# Patient Record
Sex: Male | Born: 1935 | Race: White | Hispanic: No
Health system: Midwestern US, Community
[De-identification: ages and names within clinical notes are randomized; demographics above are authoritative.]

## PROBLEM LIST (undated history)

## (undated) DIAGNOSIS — D649 Anemia, unspecified: Secondary | ICD-10-CM

## (undated) DIAGNOSIS — I10 Essential (primary) hypertension: Secondary | ICD-10-CM

## (undated) DIAGNOSIS — J449 Chronic obstructive pulmonary disease, unspecified: Secondary | ICD-10-CM

## (undated) DIAGNOSIS — Z950 Presence of cardiac pacemaker: Secondary | ICD-10-CM

## (undated) DIAGNOSIS — I2510 Atherosclerotic heart disease of native coronary artery without angina pectoris: Secondary | ICD-10-CM

---

## 2006-06-27 ENCOUNTER — Inpatient Hospital Stay
Admission: EM | Admit: 2006-06-27 | Discharge: 2006-07-01 | Disposition: A | Discharge status: Home Health | Source: Emergency Department | Attending: Internal Medicine | Admitting: Internal Medicine

## 2006-06-27 NOTE — H&P (Unsigned)
[facility]   [address]   [place], [place]. [address]   [phone]     HISTORY AND PHYSICAL    NAME: Gilbert Nunez, Gilbert Nunez MR: [id number]  LOC: ER SEX: [name]: [id number]  DOB: [birth year] AGE: 73 PT: E  ADMIT: 06/27/2006 DSCH: MSV: EMR    DATE OF ADMISSION: 06-27-06    REASON FOR ADMISSION: Shortness of breath and chills.    [facility]    HISTORY OF PRESENT ILLNESS:  Mr. Phillippi is a 73 year old gentleman who presents to the ER complaining  of chills, chest tightness. Mr. Okerlund states he was feeling his usual  self yesterday but this morning when he woke up he had chills, tightness  in his chest, some mild productive cough. He states he's had rhinorrhea  for several weeks. He just overall feels ill. On my exam he denies  nausea or vomiting though there was some report he had some nausea  earlier by the ER. He denies chest pain. He complains of a sensation of  tightness in his chest, says he's had shortness of breath which has been  chronic for four to five months. He denies abdominal pain, denies  nausea, vomiting, diarrhea or constipation. Denies dysuria. He says his  appetite has been fair until today. Mr. Jarnigan has a history of Crohn's  disease and is status post colon resection x2 in the 1950's and 1960's.  He also has a history of coronary artery disease and is status post CABG  in 1998. He seems to be otherwise relatively active. He lives alone. He  does have some limitations secondary to some vision problems. On  evaluation in the ER he's been found to be tachycardic with heart rate in  the 110's to 130's with an elevated lactic acid. We've been asked to  evaluate him for admission.    PAST MEDICAL HISTORY:   1. Crohn's disease.   2. Coronary artery disease.   3. Glaucoma.   4. Macular degeneration.    PAST SURGICAL HISTORY:   1. Colon resection in 1959 and 1967.   2. CABG in 1998.    SOCIAL HISTORY:  Patient does have a long standing history of tobacco for most of his life   but he quit about eight years ago. He doesn't really drink alcohol with  the exception of an occasional [place] Cr me. He lives alone. He does  have a son in the [place] area who is actually an Charity fundraiser here at the  hospital.    ALLERGIES: No known drug allergies.    MEDICATIONS:   1. Metoprolol 50mg  p.o. b.i.d.   2. Lisinopril 20mg  p.o. daily.   3. Norvasc 10mg  p.o. daily.   4. Timolol ophthalmic one drop in each eye twice a day.   5. Bromadine ophthalmic one drop in each eye twice a day.   6. Travatan ophthalmic one drop in each eye every evening.    REVIEW OF SYSTEMS:  The patient has chills, mild productive cough, shortness of breath with  chest tightness and rhinorrhea. He denies chest pain, denies nausea,  vomiting, abdominal pain, diarrhea, constipation, dysuria. Denies  dizziness or vision changes. Denies rashes. Appetite has been fair. He  does have some lower extremity edema in the right leg greater than the  left. This is chronic since vein harvesting for his CABG.    PHYSICAL EXAMINATION:  Temperature 99.6, heart rate 133, now down to 115 after some IV fluids,  blood pressure 132/61, respirations 18, oxygen 95% on two liters. In  general, this is a well developed well nourished but ill appearing  gentleman who is pleasant to talk to. HEENT: normocephalic, atraumatic.  Pupils equal and reactive. Oropharynx is pink. Neck is somewhat limited  in mobility and patient states this is chronic. Heart: regular rhythm  with tachycardia. No murmur. Lungs are clear to auscultation  anteriorly. Abdomen is soft, non-tender, non-distended. Extremities: 1+  pitting edema in the right lower extremity. No edema in the left.  Neurologic: cranial nerves grossly intact. Psychiatric: patient is alert  and appropriate.    EKG was reviewed by me and shows tachycardia with a left bundle branch  block. Chest x-ray was reviewed by me and confirmed with the  radiologist. I question whether there may be the beginnings of a left   lower lobe infiltrate however Radiology report did not find any focal  consolidations.  LABORATORY DATA:  WBC 8400, 14% bands, 81% neutrophils, hemoglobin 11.2, hematocrit 36.3,  platelets 223,000, MCV 70. Metabolic panel: sodium 137, potassium 3.4,  chloride 100, bicarb 24, BUN 13, creatinine 1.1, glucose 174, calcium 9.  CKMB 2.6, myoglobin 83, troponin less than 0.05. BNP mildly elevated at  150. Urinalysis: negative nitrites, negative leukocytes. Liver function  tests: total bilirubin elevated at 4.2 with a normal SGOT of 27, normal  SGPT 35, alkaline phosphatase 98.    ASSESSMENT:  Mr. Ledford is a 73 year old gentleman who presents with acute onset of  chills, subjective fever and chest tightness. Chest x-ray is suggestive  of possible early pneumonia in the left lower lobe.   1. Early sepsis with tachycardia, bandemia, increased lactate and low   grade temperature.   2. Possible left lower lobe pneumonia.   3. Elevated total bilirubin with no GI complaints and otherwise normal   liver function tests. Concern for hemolysis as the etiology.   4. Mild hypokalemia.   5. History of coronary artery disease.    PLAN:  Mr. Doolan will be admitted to a remote telemetry bed. He'll be given IV  fluids aggressively though carefully given his cardiac history. He'll be  treated with IV antibiotics with Levaquin. We will swab him for  influenza. I'll also obtain a fractionated bilirubin in order to  evaluate for hemolysis, possible DIC. Will treat the patient  supportively with oxygen. Will replete his potassium and will follow him  closely.           __________________________________   Carman Ching, MD A         This is an unverified document unless signed by physician.  TID: [id number] DT: 06/27/2006 3:58 P  JOB: [id number] DOC#: [id number] DD: 06/27/2006   cc: [facility]   Carman Ching, MD

## 2006-07-01 NOTE — Discharge Summary (Unsigned)
[facility]   [address]   [place], [place]. [address]   [phone]     DISCHARGE SUMMARY    NAME: Gilbert Nunez, Gilbert Nunez MR: [id number]  LOC: [id number] SEX: M ACCT: [id number]  DOB: 1927/11/25 AGE: 73 PT: I  ADMIT: 06/27/2006 DSCH: MSV: MED      PRIMARY CARE PHYSICIAN: [facility]    HISTORY OF PRESENT ILLNESS: Gilbert Nunez is a 73 year old white male who  was admitted on 06/27/06. The patient presented at that time with  complaints of shortness of breath and chills. He apparently was not  feeling himself for about a day prior to his presentation. On the day of  his admission, he had chills, complaints of tightness in his chest and  some productive cough. In the Emergency Room he had an x-ray that showed  a developing left lung infiltrate. He also had white blood cell count  with 14% bandemia. He was tachycardic and febrile and so was admitted  for further management. The patient also had some abnormal liver  function tests, specifically an elevated bilirubin level, which was  thought to be secondary to hemolysis related to his sepsis and his  vitamins. The patient was admitted with a diagnosis of sepsis and left  lower lung pneumonia and hyperbilirubinemia, also mild hypokalemia.  Throughout the patient's hospital course he was treated with Levaquin and  he quickly defervesced. His hyperbilirubinemia also decreased. His  bilirubin today is 2.3. It was thought that maybe his hyperbilirubinemia  may have been related to some hemolysis because of borderline low  haptoglobin levels but also may be related to his vitamins. The patient  takes multiple supplements, specifically DHEA and Coenzyme Q10. It will  be recommended that patient have a follow up of these studies in another  week to make sure this is improved. His other liver function tests were  within normal limits. At this time, the patient is stable and readied  for discharge.    DISCHARGE DIAGNOSES:  1. Sepsis: This has resolved.   2. Left lower lobe pneumonia: The patient has been treated with   Levaquin, he is now changed to by mouth, and he will be discharged   with an additional seven days.  3. Isolated hyperbilirubinemia: Etiology not clear, however suspect   multivitamins as causative. He will need to have this followed up   within a week. He is advised to hold all his supplements until his   follow up blood work and he is advised by his physician.  4. Anemia: The patient gives history of thalassemia, however he had   some evidence of iron deficiency. We will refer him back to his   primary care physician for outpatient workup.  5. Chronic back pain.  6. History of coronary artery disease.  7. Hypokalemia: This has been repleted.                       __________________________________   Ferdinand Cava, MD A     This is an unverified document unless signed by physician.    TID: [id number] DT: 07/01/2006 10:08 A  JOB: [id number] DOC#: [id number] DD: 07/01/2006    cc: Ferdinand Cava, MD

## 2010-12-10 ENCOUNTER — Inpatient Hospital Stay
Admit: 2010-12-10 | Discharge: 2010-12-13 | Disposition: A | Discharge status: Home / Self Care | Source: Home / Self Care | Attending: Cardiovascular Disease | Admitting: Cardiovascular Disease

## 2010-12-10 DIAGNOSIS — I214 Non-ST elevation (NSTEMI) myocardial infarction: Secondary | ICD-10-CM

## 2010-12-10 LAB — FERRITIN: Ferritin: 67 NG/ML (ref 8–388)

## 2010-12-10 LAB — METABOLIC PANEL, COMPREHENSIVE
A-G Ratio: 1 — ABNORMAL LOW (ref 1.2–3.5)
ALT (SGPT): 20 U/L (ref 12–65)
AST (SGOT): 28 U/L (ref 15–37)
Albumin: 3.4 g/dL (ref 3.2–4.6)
Alk. phosphatase: 61 U/L (ref 50–136)
Anion gap: 12 mmol/L (ref 7–16)
BUN: 14 MG/DL (ref 8–23)
Bilirubin, total: 3.1 MG/DL — ABNORMAL HIGH (ref 0.2–1.1)
CO2: 24 MMOL/L (ref 21–32)
Calcium: 8.5 MG/DL (ref 8.3–10.4)
Chloride: 106 MMOL/L (ref 98–107)
Creatinine: 1.2 MG/DL (ref 0.8–1.5)
GFR est AA: >60 mL/min/{1.73_m2} (ref >60)
GFR est non-AA: >60 mL/min/{1.73_m2} (ref >60)
Globulin: 3.4 g/dL (ref 2.3–3.5)
Glucose: 135 MG/DL — ABNORMAL HIGH (ref 65–100)
Potassium: 3.2 MMOL/L — ABNORMAL LOW (ref 3.5–5.1)
Protein, total: 6.8 g/dL (ref 6.3–8.2)
Sodium: 142 MMOL/L (ref 136–145)

## 2010-12-10 LAB — EKG, 12 LEAD, INITIAL
Atrial Rate: 92 {beats}/min
Calculated P Axis: 63 degrees
Calculated R Axis: 23 degrees
Calculated T Axis: -176 degrees
P-R Interval: 256 ms
Q-T Interval: 370 ms
QRS Duration: 162 ms
QTC Calculation (Bezet): 457 ms
Ventricular Rate: 92 {beats}/min

## 2010-12-10 LAB — CBC WITH AUTOMATED DIFF
ABS. BASOPHILS: 0 10*3/uL (ref 0.0–0.2)
ABS. EOSINOPHILS: 0 10*3/uL (ref 0.0–0.8)
ABS. IMM. GRANS.: 0 10*3/uL (ref 0.0–0.5)
ABS. LYMPHOCYTES: 0.9 10*3/uL (ref 0.5–4.6)
ABS. MONOCYTES: 1.1 10*3/uL (ref 0.1–1.3)
ABS. NEUTROPHILS: 6.3 10*3/uL (ref 1.7–8.2)
BASOPHILS: 0 % (ref 0.0–2.0)
EOSINOPHILS: 0 % — ABNORMAL LOW (ref 0.5–7.8)
HCT: 29.8 % — ABNORMAL LOW (ref 41.1–50.3)
HGB: 9.6 g/dL — ABNORMAL LOW (ref 13.2–17.1)
IMMATURE GRANULOCYTES: 0.1 % (ref 0.0–5.0)
LYMPHOCYTES: 11 % — ABNORMAL LOW (ref 13–44)
MCH: 20.6 PG — ABNORMAL LOW (ref 26.1–32.9)
MCHC: 32.2 g/dL (ref 31.4–35.0)
MCV: 64.1 FL — ABNORMAL LOW (ref 79.6–97.8)
MONOCYTES: 14 % — ABNORMAL HIGH (ref 4.0–12.0)
MPV: 10.1 FL — ABNORMAL LOW (ref 10.8–14.1)
NEUTROPHILS: 75 % (ref 43–78)
PLATELET: 171 10*3/uL (ref 150–450)
RBC: 4.65 M/uL (ref 4.23–5.67)
RDW: 15.8 % — ABNORMAL HIGH (ref 11.9–14.6)
WBC: 8.3 10*3/uL (ref 4.3–11.1)

## 2010-12-10 LAB — POC TROPONIN: Troponin-I (POC): 0.65 ng/ml — ABNORMAL HIGH (ref 0.0–0.08)

## 2010-12-10 LAB — PTT: aPTT: 25.9 s (ref 23.5–31.7)

## 2010-12-10 LAB — IRON: Iron: 23 ug/dL — ABNORMAL LOW (ref 35–150)

## 2010-12-10 LAB — VITAMIN B12: Vitamin B12: 309 pg/mL (ref >200)

## 2010-12-10 LAB — BNP: BNP: 477 pg/mL

## 2010-12-10 LAB — PROTHROMBIN TIME + INR
INR: 1.1 (ref 0.9–1.2)
Prothrombin time: 10.9 s — ABNORMAL HIGH (ref 9.4–10.8)

## 2010-12-10 MED ORDER — NITROGLYCERIN 0.4 MG SUBLINGUAL TAB
0.4 mg | SUBLINGUAL | Status: DC | PRN
Start: 2010-12-10 — End: 2010-12-10

## 2010-12-10 MED ORDER — MIDAZOLAM 1 MG/ML IJ SOLN
1 mg/mL | INTRAMUSCULAR | Status: DC | PRN
Start: 2010-12-10 — End: 2010-12-10
  Administered 2010-12-10: 20:00:00 via INTRAVENOUS

## 2010-12-10 MED ORDER — FUROSEMIDE 10 MG/ML IJ SOLN
10 mg/mL | Freq: Once | INTRAMUSCULAR | Status: AC
Start: 2010-12-10 — End: 2010-12-10
  Administered 2010-12-10: 21:00:00 via INTRAVENOUS

## 2010-12-10 MED ORDER — FENTANYL CITRATE (PF) 50 MCG/ML IJ SOLN
50 mcg/mL | INTRAMUSCULAR | Status: DC | PRN
Start: 2010-12-10 — End: 2010-12-10
  Administered 2010-12-10: 20:00:00 via INTRAVENOUS

## 2010-12-10 MED ORDER — NITROGLYCERIN IN D5W 200 MCG/ML IV
50 mg/2 mL (200 mcg/mL) | INTRAVENOUS | Status: DC
Start: 2010-12-10 — End: 2010-12-12
  Administered 2010-12-10 – 2010-12-11 (×10): via INTRAVENOUS

## 2010-12-10 MED ORDER — SODIUM CHLORIDE 0.9 % IV
INTRAVENOUS | Status: DC
Start: 2010-12-10 — End: 2010-12-10
  Administered 2010-12-10: 18:00:00 via INTRAVENOUS

## 2010-12-10 MED ORDER — MORPHINE 2 MG/ML INJECTION
2 mg/mL | INTRAMUSCULAR | Status: DC | PRN
Start: 2010-12-10 — End: 2010-12-13
  Administered 2010-12-10: 21:00:00 via INTRAVENOUS

## 2010-12-10 MED ORDER — HYDRALAZINE 20 MG/ML IJ SOLN
20 mg/mL | INTRAMUSCULAR | Status: DC | PRN
Start: 2010-12-10 — End: 2010-12-13
  Administered 2010-12-12: 21:00:00 via INTRAVENOUS

## 2010-12-10 MED ORDER — IOVERSOL 350 MG/ML IV SOLN
350 mg iodine/mL | Freq: Once | INTRAVENOUS | Status: AC
Start: 2010-12-10 — End: 2010-12-10
  Administered 2010-12-10: 20:00:00 via INTRA_ARTERIAL

## 2010-12-10 MED ORDER — HYDROMORPHONE (PF) 1 MG/ML IJ SOLN
1 mg/mL | Freq: Once | INTRAMUSCULAR | Status: AC
Start: 2010-12-10 — End: 2010-12-10
  Administered 2010-12-10 (×2): via INTRAVENOUS

## 2010-12-10 MED ORDER — ASPIRIN 325 MG TAB
325 mg | Freq: Once | ORAL | Status: AC
Start: 2010-12-10 — End: 2010-12-10
  Administered 2010-12-10: 18:00:00 via ORAL

## 2010-12-10 MED ADMIN — bivalirudin (ANGIOMAX) 250 mg in 0.9% sodium chloride (MBP/ADV) 50 mL infusion: INTRAVENOUS | @ 20:00:00 | NDC 65293000101

## 2010-12-10 MED ADMIN — bivalirudin (ANGIOMAX) BOLUS 47.5 mg: INTRAVENOUS | @ 20:00:00 | NDC 88888888801

## 2010-12-10 MED ADMIN — heparin (PF) 2 units/ml in NS infusion: INTRA_ARTERIAL | @ 20:00:00 | NDC 00409762059

## 2010-12-10 MED ADMIN — potassium chloride (K-DUR, KLOR-CON) SR tablet 40 mEq: ORAL | @ 18:00:00 | NDC 00245005889

## 2010-12-10 MED ADMIN — HYDROmorphone (PF) (DILAUDID) injection 1 mg: INTRAVENOUS | @ 22:00:00 | NDC 00409255201

## 2010-12-10 MED ADMIN — nitroglycerin 0.2 mg/ml IV syringe: INTRACORONARY | @ 20:00:00 | NDC 99990002792

## 2010-12-10 MED ADMIN — ticagrelor (BRILINTA) tablet 180 mg: ORAL | @ 20:00:00 | NDC 00186077739

## 2010-12-10 NOTE — Procedures (Signed)
[facility]                            [address]                           [place], [place]. [address]                                [phone]               [facility] - CATH LAB REPORT    NAME:  Gilbert, Nunez                             MR:  [id number]  LOC:  ER                    SEX:  Gilbert Nunez:  [id number]  DOB:  1928-01-05            AGE:  77              PT:  E  ADMIT:  12/10/2010          DSCH:                 MSV:  EMR      PROCEDURES  1. Left heart catheterization, selective coronary arteriography, and left  ventriculogram.  2. A 6-French venous sheath placement in the right common femoral vein.    INDICATIONS  1. Non-ST-elevation myocardial infarction in a patient with known  coronary artery disease and prior bypass surgery.  2. Episodic heart block. A 6-French venous sheath placed in case a  pacemaker was needed during his cardiac catheterization and  interventional procedure.    TECHNICAL FACTORS: After informed consent was obtained, the patient was  brought to the cardiac catheterization lab. The right femoral artery was  prepped and draped in the usual sterile fashion. Utilizing a modified  Seldinger technique, a 6-French arterial sheath was placed in the right  common femoral artery and a 6-French venous sheath was placed in the  right common femoral vein. A 6-French JL4 catheter was used to  selectively engage the ostium of the left main coronary artery and a  6-French JR4 catheter was used to selectively engage the ostium of the  right coronary artery, saphenous vein graft to obtuse marginal artery,  and saphenous vein graft to the right coronary artery. Selective  injections in various projections were performed. A pigtail catheter was  used to cross the aortic valve and enter the left ventricle. Hemodynamic  measurements and left ventriculogram were obtained. Left ventricular   aortic pressure gradient was obtained by pullback technique.    At the conclusion of the diagnostic procedure, the patient was referred  for PCI of the left main and LAD. Please see procedure note for details.      CONTRAST: Optiray 190 mL for the interventional and diagnostic  procedures.    HEMODYNAMIC RESULTS  1. Aortic pressure 132/48 with a mean of 82.  2. Left ventricular end-diastolic pressure was 20.  3. There was no significant gradient across the aortic valve.    ANGIOGRAPHIC RESULTS  1. Left main coronary artery:  Contains a subtotal occlusion in the  proximal to mid transition with significant haziness concerning for  ruptured plaque.  2. LAD: Medium caliber vessel. Diffuse 70% proximal stenosis. There is a  focal high-grade 90 to 95% stenosis at the origin of the first diagonal  artery. The mid to distal transition contains a 60% stenosis. This is  smooth in appearance.  3. First diagonal artery: Small caliber vessel. It contains ostial 60%  stenosis and a proximal area of ectasia followed by 60% stenosis. The  vessel is 2 to 2.25 mm diameter.  4. The left circumflex is 100% occluded at the ostium.  5. The right coronary artery is 100% proximally occluded.    BYPASS GRAFT ANATOMY  1. Saphenous vein graft to the first obtuse marginal artery is a medium  to large caliber vessel. It contains diffuse 20% luminal irregularities.  It attaches to a small to medium caliber first obtuse marginal artery.  After anastomosis, this divides into an inferior and superior branch. The  superior branch is very small caliber and diffusely diseased. The  inferior branch is small to medium caliber and contains an area of 60%  stenosis. The inferior branch is small caliber and contains a 60%  stenosis. The vessel is 2 to 2.25 mm in diameter.  2. Right coronary artery: Medium to large caliber vessel. Diffuse 20% mid  to distal irregularities. It appears the bypass graft actually attaches   to an RV marginal branch and then retrograde fills the true RCA. In the  mid RCA after the anastomosis, there is a 70% stenosis and the right PDA  also contains a 70% stenosis. Fairly small caliber vessel.  3. Left ventriculogram performed in RAO projection shows moderately  reduced LV systolic function with EF of 35%. There is moderate anterior  and apical hypokinesis. 1+ mitral regurgitation.    CONCLUSIONS  1. Multivessel coronary artery disease.  2. Two of two bypass grafts patent.  3. Moderate left ventricular dysfunction as mentioned above.    PLAN: It appears the culprit of his infarct is the subtotally occluded  left main coronary artery and high-grade LAD stenosis. Will proceed with  PCI of the protected left main.    PCI NOTE    TECHNICAL FACTORS: After the diagnostic images, the films were reviewed  and it was felt the patient was best suited for PCI of his left main  extending into his mid LAD. The patient was given intravenous Angiomax.  ACT was greater than 400. An XB 3.5, 6-French guide was used to  selectively engage the ostium of the left main coronary artery. A Whisper  extra support wire was positioned in the LAD and a Prowater wire was  positioned in the first diagonal artery. A 2.5 x 15 mm NC TREK balloon  was positioned in the mid LAD and inflated to a maximum pressure of 18  atmospheres on 3 separate occasions going back to the left main coronary  artery. At this point, given the heavy calcification, a Flextome 2.75 x  10 mm balloon was positioned in the left main and proximal LAD and  deployed on 2 separate occasions to 12 atmospheres with appropriate  balloon expansion. A 2.5 x 23 mm Xience drug-eluting stent was then  positioned in the mid LAD extending to the proximal vessel. This was  deployed to 12 atmospheres for 17 seconds. Following this, a Xience 3.0 x  23 mm drug-eluting stent was positioned from the proximal LAD extending   to  the ostium of the left main coronary artery. This was then deployed to  14 atmospheres. Following stent deployment, a TREK NC 2.5 x 15 mm balloon  was positioned in the mid LAD and deployed at 20 atmospheres for 22  seconds. Then an NC TREK 3.0 x 15 mm balloon was positioned in the mid  proximal and left main and deployed to a maximum pressure of 20  atmospheres. Following post-dilatation, there was acceptable angiographic  result with no residual stenosis.    An IVUS catheter was advanced and the proximal LAD to left main was  imaged. This showed the stent was well opposed. There was adequate stent  expansion and the stent appeared to extend out to the ostium of the left  main coronary artery, but no further. The guidewire was removed and final  orthogonal projections were obtained. This showed an acceptable  angiographic result. A distal 60% stenosis had not changed. The diagonal  60% ostial stenosis was unchanged as well. The patient was given 180 mg  of Brilinta and the sheaths were sutured in place.    CONCLUSION  1. Successful percutaneous coronary intervention and stenting of the  proximal to mid left anterior descending with a 2.5 x 23 mm Xience  drug-eluting stent.  2. Successful percutaneous coronary intervention and stenting of the left  main coronary artery with stent extending into the proximal left anterior  descending with 3.0 x 23 mm Xience drug-eluting stent. This was  overlapped with the proximal to mid left anterior descending stent.  4. Successful intravascular ultrasound pre- and post-stenting of the left  main coronary artery and left anterior descending.                Rhona Raider Berlie Hatchel, MD                This is an unverified document unless signed by physician.    TID:  [id number]                                      DT:  12/10/2010  5:32 P  JOB:  [id number]        DOC#:  [id number]           DD:  12/10/2010    cc:   Rhona Raider Infant Doane, MD

## 2010-12-10 NOTE — Progress Notes (Signed)
Pt's ACT=150

## 2010-12-10 NOTE — Progress Notes (Signed)
Pt's son took possession of pt's wallet, one yellow-colored ring, one yellow-colored ring with clear stones, and one yellow-colored watch.

## 2010-12-10 NOTE — Progress Notes (Signed)
Brief Cardiac Catheterization Procedure Note    Patient: Gilbert Nunez MRN: [id number]  SSN: [id number]    Date of Birth: [birth year]  Age: 77 y.o.  Sex: male      Date of Procedure: 12/10/2010     Pre-procedure Diagnosis: NSTEMI    Post-procedure Diagnosis: Coronary Artery Disease    Procedure: Left Heart Catheterization with Percutaneous Coronary Intervention    Performed By: Ebony Hail, MD     Anesthesia: Moderate Sedation    Estimated Blood Loss: Less than 10 mL      Specimens: None    Findings: 95% LMCA and mid LAD with 70% prox LAD.  60% distal LAD.  100% Cx and RCA with patent grafts.  EF 35% with anterior and apical hypokinesis. Brillanta and ASA with angiomax.  Lasix 40 mg at completion of case due to hypoxia requiring NRB.     Complications: None    Implants: None    Recommendations: Continue medical therapy. Check CXR    Signed By: Ebony Hail, MD     December 10, 2010

## 2010-12-10 NOTE — Consults (Signed)
CONSULT NOTE    Gilbert Nunez    12/10/2010    Date of Admission:  12/10/2010    The patient's chart is reviewed and the patient is discussed with the staff.    Subjective:     Patient is a 77 y.o. Caucasian male seen and evaluated at the request of Dr. Lavone Neri.    Patient admitted with with left sided chest pain with dyspnea today. Noted to have NSTEMI and s/p cardiac stents of mild left anterior descending artery and left main coronary artery. Pain resolved and dyspnea resolved, however in ICU after catherization with noted on 100% FIO2 with saturation about 88%. Patient had ABG with noted PAO2 just at 60 with CO2 at 23 currently. Patient reports feels ok, but just tired. Denies worsening in breathing, states it is much better than before. He did report that he smoked for 55 pack years and quit 10 years ago. He does not use any inhalers, nebs or oxygen at home.    Patient also has been having heart block and cardiology is following. Holding off pacemaker placement for now; hopefully improvement of MI will avoid placement.      Review of Systems:  -Fever  -Headaches  +Chest pain (now resolved)  +Dyspnea (resolved), -wheezing, -cough  -Abdominal pain, -constipation  -Leg swelling  All other organ systems grossly normal.      Patient Active Problem List   Diagnoses Code   ??? NSTEMI (non-ST elevated myocardial infarction) 410.70   ??? Acute respiratory failure 518.81   ??? Acute systolic heart failure 428.21   ??? Coronary atherosclerosis of artery bypass graft 414.04   ??? Complete heart block, transient 426.0   ??? LBBB (left bundle branch block) 426.3   ??? HTN (hypertension) 401.9   ??? Other and unspecified hyperlipidemia 272.4   ??? Hypoxia 799.02         Prior to Admission Medications   Medication Last Dose Informant Patient Reported? Taking?   acetaminophen-codeine (TYLENOL #3) 300-30 mg per tablet   Yes Yes   Take 1 Tab by mouth every six (6) hours as needed.      latanoprost (XALATAN) 0.005 % ophthalmic solution   Yes Yes   Administer 1 Drop to both eyes nightly.     temazepam (RESTORIL) 30 mg capsule   Yes Yes   Take 30 mg by mouth nightly as needed.     timolol (TIMOPTIC) 0.25 % ophthalmic solution   Yes Yes   Administer 1 Drop to both eyes two (2) times a day.            Past Medical History   Diagnosis Date   ??? Actinic keratitis    ??? Opioid dependence    ??? Senile macular degeneration    ??? Glaucoma    ??? Male erectile disorder    ??? Anemia    ??? Vitamin B 12 deficiency    ??? Crohn disease    ??? Osteoarthritis    ??? Coronary artery disease      Past Surgical History   Procedure Date   ??? Hx cholecystectomy    ??? Hx small bowel resection      History     Social History   ??? Marital Status: Widowed     Spouse Name: N/A     Number of Children: N/A   ??? Years of Education: N/A     Occupational History   ??? Not on file.     Social History Main Topics   ???  Smoking status: Former Smoker   ??? Smokeless tobacco: Not on file   ??? Alcohol Use: No   ??? Drug Use: No   ??? Sexually Active:      Other Topics Concern   ??? Not on file     Social History Narrative   ??? No narrative on file     No family history on file.  No Known Allergies    Current Facility-Administered Medications   Medication Dose Route Frequency   ??? aspirin (ASPIRIN) tablet 325 mg  325 mg Oral ONCE   ??? potassium chloride (K-DUR, KLOR-CON) SR tablet 40 mEq  40 mEq Oral NOW   ??? bivalirudin (ANGIOMAX) BOLUS 47.5 mg  0.75 mg/kg IntraVENous ONCE   ??? ioversol (OPTIRAY) 350 mg/mL contrast solution 25-300 mL  25-300 mL IntraarTERial ONCE   ??? ticagrelor (BRILINTA) tablet 180 mg  180 mg Oral NOW   ??? furosemide (LASIX) injection 40 mg  40 mg IntraVENous ONCE   ??? latanoprost (XALATAN) 0.005 % ophthalmic solution 1 Drop  1 Drop Both Eyes QHS   ??? temazepam (RESTORIL) capsule 30 mg  30 mg Oral QHS PRN   ??? timolol (TIMOPTIC) 0.25% ophthalmic solution  1 Drop Both Eyes BID   ??? acetaminophen (TYLENOL) tablet 650 mg  650 mg Oral Q4H PRN    ??? morphine injection 2 mg  2 mg IntraVENous Q2H PRN   ??? ondansetron (ZOFRAN) injection 4 mg  4 mg IntraVENous 1 TIME PRN   ??? nitroglycerin (NITROSTAT) tablet 0.4 mg  0.4 mg SubLINGual Q5MIN PRN   ??? LORazepam (ATIVAN) tablet 1 mg  1 mg Oral Q6H PRN   ??? HYDROcodone-acetaminophen (NORCO) 7.5-325 mg per tablet 1 Tab  1 Tab Oral Q4H PRN   ??? atorvastatin (LIPITOR) tablet 40 mg  40 mg Oral QHS   ??? aspirin chewable tablet 81 mg  81 mg Oral DAILY   ??? enoxaparin (LOVENOX) injection 40 mg  40 mg SubCUTAneous Q24H   ??? lisinopril (PRINIVIL, ZESTRIL) tablet 10 mg  10 mg Oral DAILY   ??? nitroglycerin (NITROBID) 2 % ointment 0.5 Inch  0.5 Inch Topical Q6H   ??? ticagrelor (BRILINTA) tablet 90 mg  90 mg Oral BID   ??? morphine injection 2 mg  2 mg IntraVENous Multiple   ??? HYDROmorphone (PF) (DILAUDID) injection 1 mg  1 mg IntraVENous ONCE   ??? nitroglycerin (Tridil) 200 mcg/ml infusion  5-200 mcg/min IntraVENous TITRATE   ??? hydrALAZINE (APRESOLINE) 20 mg/mL injection 10 mg  10 mg IntraVENous Q4H PRN   ??? HYDROmorphone (PF) (DILAUDID) injection 1 mg  1 mg IntraVENous ONCE   ??? furosemide (LASIX) injection 40 mg  40 mg IntraVENous ONCE         Objective:     Filed Vitals:    12/10/10 1945 12/10/10 1950 12/10/10 1955 12/10/10 2000   BP:       Pulse: 53 110 95 101   Temp:       Resp: 19 20 23 20    Height:       Weight:       SpO2: 86% 88% 87% 86%     Blood pressure 138/72, pulse 101, temperature 100.4 ??F (38 ??C), resp. rate 20, height 5\' 8"  (1.727 m), weight 63.504 kg (140 lb), SpO2 86.00%.      PHYSICAL EXAM     Gen- the patient is well developed and in no acute distress. Wearing non-rebreather currently.  Neck- no JVD  Lungs- mild rhonchi right  chest. No wheezing. No crackles  Heart- RRR without M,G,R  Abd- soft and non-tender; with positive bowel sounds.  Ext- warm without cyanosis. There is no lower leg edema.  Skin- no jaundice or rashes, no wounds, just catherization site with noted sheath and femoral arterial line.   Neuro- alert and oriented    Chest X-ray:  Pulmonary edema worse than the AM      Recent Labs   Basename 25-Dec-2010 1255    WBC 8.3    HGB 9.6*    HCT 29.8*    PLT 171    INR 1.1     Recent Labs   Basename Dec 25, 2010 1930 12/25/10 1255    NA -- 142    K -- 3.2*    CL -- 106    GLU -- 135*    CO2 -- 24    BUN -- 14    CREA -- 1.2    MG -- --    PHOS -- --    CA -- 8.5    TROIP 4.04* --    ALB -- 3.4    TBIL -- 3.1*    GPT -- 20    SGOT -- 28    BNPP -- 477     Recent Labs   Basename 2010-12-25 2001    PH 7.49*    PCO2 23*    PO2 60*    HCO3 17*         Assessment:  (Medical Decision Making)     Hospital Problems  Date Reviewed: 2010-12-25      Codes Class Noted POA    *NSTEMI (non-ST elevated myocardial infarction) 410.70  2010-12-25 Yes    Coronary atherosclerosis of artery bypass graft 414.04  December 25, 2010 Yes    S/p stens, see HPI    Complete heart block, transient 426.0  12-25-2010 Yes    --per cardiology    LBBB (left bundle branch block) 426.3  25-Dec-2010 Yes        Acute respiratory failure 518.81  12-25-2010 Yes    --see below    Acute systolic heart failure 428.21  12-25-2010 Yes    --cxr in evening noted with pulmonary edema    Hypoxia 799.02  2010/12/25 Unknown    --high requirement see below    HTN (hypertension) 401.9  12-25-10 Yes        Other and unspecified hyperlipidemia 272.4  2010/12/25 Yes              Plan:  (Medical Decision Making)     --placed him on CPAP of 5 and now oxygen requirement improved to 40% and comfortable now.  --will give him lasix and monitor I & O's  --place foley since immobile s/p catherization  --hold off nebs for now, not wheezing, consider if wheezing or still with elevated oxygen requirement tomorrow. Does have a tremor in his left arm and wish to avoid it worsening.  --continue remaining treatment.    Thank you very much for this referral.  We appreciate the opportunity to participate in this patient's care.  Will follow along with above stated plan.    Damen Windsor Mcarthur Rossetti, MD

## 2010-12-10 NOTE — Progress Notes (Addendum)
Spiritual Care visit.   Spiritual Care Assessment/Progress Notes    Gilbert Nunez [id number]  [id number]    29-Nov-1927  77 y.o.  male    Patient Telephone Number: [phone] (home)   Religious Affiliation: Non denominational   Language: English   Extended Emergency Contact Information  Primary Emergency Contact: Gilbert Nunez [place]  Home Phone: [phone]  Relation: Parent   Patient Active Problem List   Diagnoses Date Noted   ??? NSTEMI (non-ST elevated myocardial infarction) 12/10/2010   ??? Acute respiratory failure 12/10/2010   ??? Acute systolic heart failure 12/10/2010   ??? Coronary atherosclerosis of artery bypass graft 12/10/2010   ??? Complete heart block, transient 12/10/2010   ??? LBBB (left bundle branch block) 12/10/2010   ??? HTN (hypertension) 12/10/2010   ??? Other and unspecified hyperlipidemia 12/10/2010   ??? Hypoxia 12/10/2010        Date: 12/10/2010       Level of Religious/Spiritual Activity:  []          Involved in faith tradition/spiritual practice    []          Not involved in faith tradition/spiritual practice  []          Spiritually oriented    []          Claims no spiritual orientation    []          seeking spiritual identity  []          Feels alienated from religious practice/tradition  []          Feels angry about religious practice/tradition  [x]          Spirituality/religious tradition IS a resource for coping at this time.  []          Not able to assess due to medical condition    Services Provided Today:  []          crisis intervention    []          reading Scriptures  [x]          spiritual assessment    [x]          prayer  []          empathic listening/emotional support  []          rites and rituals (cite in comments)  []          life review     []          religious support  []          theological development   []          advocacy  []          ethical dialog     []          blessing  []          bereavement support    []          support to family   []          anticipatory grief support   []          help with AMD  []          spiritual guidance    []          meditation      Spiritual Care Needs  []          Emotional Support  []          Spiritual/Religious Care  []          Loss/Adjustment  []   Advocacy/Referral /Ethics  [x]          No needs expressed at this time  []          Other: (note in comments)  Spiritual Care Plan  []          Follow up visits with pt/family  []          Provide materials  []          Schedule sacraments  []          Contact [facility]  [x]          Follow up as needed  []          Other: (note in comments)     Comments: Spiritual Assessment     Advance Directives  No information.  Category/Code Status Not on file.  Family Support  Yes.  Spiritual Support  Non Denominational.    Gilbert Nunez      Visit by Arelia Sneddon, M.Ed., Th.B. ,Staff Chaplain

## 2010-12-10 NOTE — ED Notes (Signed)
TRANSFER - OUT REPORT:    Verbal report given to cath lab nurse on Gilbert Nunez  being transferred to cath lab for routine progression of care       Report consisted of patient???s Situation, Background, Assessment and   Recommendations(SBAR).     Information from the following report(s) ED Summary, MAR and Recent Results was reviewed with the receiving nurse.    Opportunity for questions and clarification was provided.

## 2010-12-10 NOTE — H&P (Signed)
[facility]                            [address]                           [place], [place]. [address]                                [phone]                              HISTORY AND PHYSICAL    NAME:  Gilbert Nunez, Gilbert Nunez                             MR:  [id number]  LOC:  ER                    SEX:  Gilbert Nunez:  [id number]  DOB:  1928-01-05            AGE:  77              PT:  E  ADMIT:  12/10/2010          DSCH:                 MSV:  EMR      REASON FOR ADMISSION: Non-ST elevation myocardial infarction complicated  by a left bundle branch block. Episode of complete heart block noted on  monitor.    HISTORY OF PRESENT ILLNESS: The patient is an 77 year old, white male  with a remote history of coronary artery bypass grafting in the late  1980s. He reportedly had 2 vessels bypassed and was told that 2 vessels  were inoperable at this time. He has been maintained on medical therapy,  but has not had any recent ischemia evaluation. He notes this morning he  developed chest pain with associated dyspnea. He describes substernal  chest tightness. His symptoms lasted throughout the majority of the  morning and were improved with nitroglycerin. He went to the [place], where he  was directed to the emergency room. In the emergency room, he was noted  to have a left bundle branch block. This appears to be chronic from 2009.  While he was monitored on telemetry, he had a 4-beat pause with complete  heart block. He then went to a 2:1 AV block and back to a sinus rhythm.  He is currently chest pain free. His troponin was abnormal at 0.69. He  denies any history of syncope or knowledge of conduction system disease  in the past. He is largely inactive due to his vision problems. He has  had a retinal detachment in the past.    PAST MEDICAL HISTORY  1. Crohn disease, which appears to be well controlled. He has had 2  operations, the last being in the 1960s.   2. Degenerative joint disease.  3. Carotid stenosis, status post left carotid endarterectomy.  4. Degenerative joint disease in his back.  5. History of vision problems from retinal detachment.  6. Coronary artery disease.  7. Left bundle branch block dated from 2009.    MEDICATIONS  Include:  1. Tylenol  No. 3 as needed.  2. Dilantin.  3. Restoril.  4. Timolol.  5. Unknown blood pressure medication.    ALLERGIES: NO KNOWN DRUG ALLERGIES.    SOCIAL HISTORY: He denies any tobacco or alcohol use. He quit smoking 10  years ago.    FAMILY HISTORY: Negative for premature coronary artery disease.    REVIEW OF SYSTEMS  CONSTITUTIONAL: Fatigue. No night sweats. No weight loss or weight gain.  EYES: Diffusely decreased vision. Has a history of retinal detachment.  ENT: No changes in his hearing recently. No epistaxis.  LUNGS: Dyspnea with his recent chest pain, although he denies any history  of COPD, emphysema, asthma.  CARDIOVASCULAR: Chest pain as mentioned above.  GI: He has history of Crohn disease which has been stable off treatment,  2 prior surgeries.  GU: BPH.  MUSCULOSKELETAL: Degenerative joint disease.  NEUROLOGIC: No strokes or seizures.  PSYCHIATRIC: No active depression or anxiety.  ENDOCRINE: Denies any diabetes or lipid abnormalities. No thyroid  abnormalities.    PHYSICAL EXAMINATION  VITAL SIGNS: Temperature 98.4, pulse 87, blood pressure 152/70, pulse  oximetry 96% on room air.  GENERAL: Elderly, white male in no acute distress.  HEENT: Pupils equal, round, react to light and accommodation. Extraocular  movements intact. No scleral icterus or injection.  NECK: Supple. No jugular venous distention.  CARDIOVASCULAR: Regular rate and rhythm. 1/6 systolic ejection murmur  best heard at right upper sternal border.  LUNGS: Clear to auscultation and percussion bilaterally.  ABDOMEN: Soft, nontender, nondistended. Normoactive bowel sounds.  EXTREMITIES: Trivial edema, 1+ distal pulses.   NEUROLOGIC: Alert and oriented x3. Complete neurologic exam was deferred,  given his acute chest pain.  PSYCHIATRIC: Normal mood and affect.  SKIN: Warm and dry.    LABORATORY DATA: His troponin was 0.65. White blood cell count 8.3,  hemoglobin 9.6, platelet count was 171. Potassium was 3.2, BUN and  creatinine were 14 and 1.2. LFTs were within normal limits.    Chest x-ray was pending. EKG showed left bundle branch block with sinus  rhythm at 89 beats minute. Type 1 second-degree AV block. Wenckebach.    Review of telemetry shows episode of complete heart block. This lasted  approximately 4 seconds. Then it was followed by 2:1 AV block.    ASSESSMENT AND PLAN  1. Acute coronary syndrome: Patient with recent chest pain and positive  cardiac biomarkers concerning for myocardial infarction. His left bundle  branch block complicates the initial assessment. He is currently chest  pain free but given his conduction system abnormalities and recent chest  pain, will proceed with urgent cardiac catheterization. Discussed risks,  benefits, and alternatives and procedure with the patient in detail.  2. Hypertension: Unclear blood pressure medicine he takes at home. Will  start ACE inhibitor therapy. Avoid beta blockers, given his conduction  system disease and AV block.  3. Cholesterol status: Unknown. Check a fasting lipid panel. Start statin  therapy empirically.  4. Anemia: Uncertain acuity. Will check iron profile and anemia studies.  5. Hypokalemia: Will replete.  6. Heart block:  Has baseline LBBB.  Currently in Type I second degree AV block.  Had 4 second pause.  ? Ischemic mediated.  Will monitor in cath lab with possible temp pacemaker placement.  May need dual chamber pacemaker.   7.  Further recommendations as his course progresses. Will monitor the  patient closely, likely in the CCU setting after his cardiac  catheterization.  Rhona Raider Vannesa Abair, MD                 This is an unverified document unless signed by physician.    TID:  [id number]                                      DT:  12/10/2010  2:33 P  JOB:  [id number]        DOC#:  [id number]           DD:  12/10/2010     cc:  Rhona Raider Camaya Gannett, MD

## 2010-12-10 NOTE — Progress Notes (Signed)
TRANSFER - OUT REPORT:    Verbal report given to RN(name) on Gilbert Nunez  being transferred to CPRU(unit) for routine progression of care       Report consisted of patient???s Situation, Background, Assessment and   Recommendations(SBAR).     Information from the following report(s) Procedure Summary was reviewed with the receiving nurse.    Opportunity for questions and clarification was provided.      LHC/PCI completed by Dr Theressa Millard   Stent to Left Main and LAD  6 fr right FA    6 fr right FV  angiomax off at 1635  2 mg versed 25 mcg fentanyl   180 mg brilinta   40 mg lasix   325 mg asa given at 1338

## 2010-12-10 NOTE — Progress Notes (Signed)
Arterial sheath pulled at 23:23. Manual compression held until 23:53; hemostasis obtained; neither bleeding nor hematoma noted.    Venous sheath pulled at 23:45. Manual compression held until 23:53; hemostasis obtained; neither bleeding nor hematoma noted.    Gauze and transparent dressing applied; sandbag in place.    Pt tolerated procedure with minimal discomfort.

## 2010-12-10 NOTE — ED Provider Notes (Signed)
Patient is a 77 y.o. male presenting with chest pain. The history is provided by the patient. No language interpreter was used.   Chest Pain (Angina)   This is a new problem. The current episode started 3 to 5 hours ago. The problem has been gradually improving. The pain is associated with rest. The pain is present in the substernal region. The pain is moderate. The quality of the pain is described as pressure-like. The pain does not radiate. Associated symptoms include shortness of breath. Pertinent negatives include no abdominal pain, no back pain, no claudication, no diaphoresis, no dizziness, no exertional chest pressure, no fever, no headaches, no irregular heartbeat, no leg pain, no lower extremity edema, no malaise/fatigue, no nausea, no near-syncope, no numbness, no orthopnea, no palpitations, no PND, no vomiting and no weakness. He has tried nothing for the symptoms. Risk factors include cardiac disease. His past medical history does not include aneurysm, cancer, DM, DVT, HTN, PE or CHF. Procedural history includes CABG.       Past Medical History   Diagnosis Date   ??? Actinic keratitis    ??? Opioid dependence    ??? Senile macular degeneration    ??? Glaucoma    ??? Male erectile disorder    ??? Anemia    ??? Vitamin B 12 deficiency    ??? Crohn disease    ??? Osteoarthritis    ??? Coronary artery disease         Past Surgical History   Procedure Date   ??? Hx cholecystectomy    ??? Hx small bowel resection          No family history on file.     History     Social History   ??? Marital Status: Widowed     Spouse Name: N/A     Number of Children: N/A   ??? Years of Education: N/A     Occupational History   ??? Not on file.     Social History Main Topics   ??? Smoking status: Former Smoker   ??? Smokeless tobacco: Not on file   ??? Alcohol Use: No   ??? Drug Use: No   ??? Sexually Active:      Other Topics Concern   ??? Not on file     Social History Narrative   ??? No narrative on file                   ALLERGIES: Review of patient's allergies indicates no known allergies.      Review of Systems   Constitutional: Negative for fever, malaise/fatigue and diaphoresis.   Respiratory: Positive for shortness of breath.    Cardiovascular: Positive for chest pain. Negative for palpitations, orthopnea, claudication, PND and near-syncope.   Gastrointestinal: Negative for nausea, vomiting and abdominal pain.   Musculoskeletal: Negative for back pain.   Neurological: Negative for dizziness, weakness, numbness and headaches.   All other systems reviewed and are negative.        Filed Vitals:    12/10/10 1259   BP: 152/70   Pulse: 87   Temp: 98.4 ??F (36.9 ??C)   Resp: 20   Height: 5\' 8"  (1.727 m)   Weight: 63.504 kg (140 lb)   SpO2: 96%            Physical Exam   Constitutional: He is oriented to person, place, and time. He appears well-developed and well-nourished.   HENT:   Head: Normocephalic and atraumatic.  Eyes: Conjunctivae are normal. Pupils are equal, round, and reactive to light.   Neck: Normal range of motion. Neck supple.   Cardiovascular: Normal rate and regular rhythm.    Pulmonary/Chest: Effort normal and breath sounds normal.   Abdominal: Soft. Bowel sounds are normal.   Musculoskeletal: Normal range of motion.   Neurological: He is alert and oriented to person, place, and time.   Skin: Skin is warm and dry.   Psychiatric: He has a normal mood and affect. His behavior is normal.        MDM     Differential Diagnosis; Clinical Impression; Plan:     Stable. Nontoxic. Afebrile.     13:50. [facility] consulted regarding patient EKG. Possible Scarbosa Criteria.  Will review with attending. Dr. Theressa Millard at bedside. Assumes care.  Plan to take to cath lab soon..   Amount and/or Complexity of Data Reviewed:   Clinical lab tests:  Reviewed  Tests in the radiology section of CPT??:  Reviewed  Tests in the medicine section of the CPT??:  Reviewed   Independant visualization of image, tracing, or specimen:  Yes   Risk of Significant Complications, Morbidity, and/or Mortality:   Presenting problems:  Moderate  Diagnostic procedures:  Moderate  Management options:  Moderate  Progress:   Patient progress:  Stable      Procedures

## 2010-12-10 NOTE — Procedures (Signed)
[facility]                            [address]                           [place], [place]. [address]                                [phone]               [facility] - CATH LAB REPORT    NAME:  Gilbert Nunez, Gilbert Nunez                             MR:  [id number]  LOC:  ER                    SEX:  [name]:  [id number]  DOB:  04-22-1928            AGE:  77              PT:  E  ADMIT:  12/10/2010          DSCH:                 MSV:  EMR      PROCEDURES  1. Left heart catheterization, selective coronary arteriography, and left  ventriculogram.  2. A 6-French venous sheath placement in the right common femoral vein.    INDICATIONS  1. Non-ST-elevation myocardial infarction in a patient with known  coronary artery disease and prior bypass surgery.  2. Episodic heart block. A 6-French venous sheath placed in case a  pacemaker was needed during his cardiac catheterization and  interventional procedure.    TECHNICAL FACTORS: After informed consent was obtained, the patient was  brought to the cardiac catheterization lab. The right femoral artery was  prepped and draped in the usual sterile fashion. Utilizing a modified  Seldinger technique, a 6-French arterial sheath was placed in the right  common femoral artery and a 6-French venous sheath was placed in the  right common femoral vein. A 6-French JL4 catheter was used to  selectively engage the ostium of the left main coronary artery and a  6-French JR4 catheter was used to selectively engage the ostium of the  right coronary artery, saphenous vein graft to obtuse marginal artery,  and saphenous vein graft to the right coronary artery. Selective  injections in various projections were performed. A pigtail catheter was  used to cross the aortic valve and enter the left ventricle. Hemodynamic  measurements and left ventriculogram were obtained. Left ventricular  aortic pressure gradient was obtained by  pullback technique.    At the conclusion of the diagnostic procedure, the patient was referred  for PCI of the left main and LAD. Please see procedure note for details.      CONTRAST: Optiray 190 mL for the interventional and diagnostic  procedures.    HEMODYNAMIC RESULTS  1. Aortic pressure 132/48 with a mean of 82.  2. Left ventricular end-diastolic pressure was 20.  3. There was no significant gradient across the aortic valve.    ANGIOGRAPHIC RESULTS  1. Left main coronary artery:  Contains a subtotal occlusion in the  proximal to mid transition with significant haziness concerning for  ruptured plaque.  2. LAD: Medium caliber vessel. Diffuse 70% proximal stenosis. There is a  focal high-grade 90 to 95% stenosis at the origin of the first diagonal  artery. The mid to distal transition contains a 60% stenosis. This is  smooth in appearance.  3. First diagonal artery: Small caliber vessel. It contains ostial 60%  stenosis and a proximal area of ectasia followed by 60% stenosis. The  vessel is 2 to 2.25 mm diameter.  4. The left circumflex is 100% occluded at the ostium.  5. The right coronary artery is 100% proximally occluded.    BYPASS GRAFT ANATOMY  1. Saphenous vein graft to the first obtuse marginal artery is a medium  to large caliber vessel. It contains diffuse 20% luminal irregularities.  It attaches to a small to medium caliber first obtuse marginal artery.  After anastomosis, this divides into an inferior and superior branch. The  superior branch is very small caliber and diffusely diseased. The  inferior branch is small to medium caliber and contains an area of 60%  stenosis. The inferior branch is small caliber and contains a 60%  stenosis. The vessel is 2 to 2.25 mm in diameter.  2. Right coronary artery: Medium to large caliber vessel. Diffuse 20% mid  to distal irregularities. It appears the bypass graft actually attaches  to an RV marginal branch and then retrograde fills the true RCA. In the  mid  RCA after the anastomosis, there is a 70% stenosis and the right PDA  also contains a 70% stenosis. Fairly small caliber vessel.  3. Left ventriculogram performed in RAO projection shows moderately  reduced LV systolic function with EF of 35%. There is moderate anterior  and apical hypokinesis. 1+ mitral regurgitation.    CONCLUSIONS  1. Multivessel coronary artery disease.  2. Two of two bypass grafts patent.  3. Moderate left ventricular dysfunction as mentioned above.    PLAN: It appears the culprit of his infarct is the subtotally occluded  left main coronary artery and high-grade LAD stenosis. Will proceed with  PCI of the protected left main.    PCI NOTE    TECHNICAL FACTORS: After the diagnostic images, the films were reviewed  and it was felt the patient was best suited for PCI of his left main  extending into his mid LAD. The patient was given intravenous Angiomax.  ACT was greater than 400. An XB 3.5, 6-French guide was used to  selectively engage the ostium of the left main coronary artery. A Whisper  extra support wire was positioned in the LAD and a Prowater wire was  positioned in the first diagonal artery. A 2.5 x 15 mm NC TREK balloon  was positioned in the mid LAD and inflated to a maximum pressure of 18  atmospheres on 3 separate occasions going back to the left main coronary  artery. At this point, given the heavy calcification, a Flextome 2.75 x  10 mm balloon was positioned in the left main and proximal LAD and  deployed on 2 separate occasions to 12 atmospheres with appropriate  balloon expansion. A 2.5 x 23 mm Xience drug-eluting stent was then  positioned in the mid LAD extending to the proximal vessel. This was  deployed to 12 atmospheres for 17 seconds. Following this, a Xience 3.0 x  23 mm drug-eluting stent was positioned from the proximal LAD extending  to the  ostium of the left main coronary artery. This was then deployed to  14 atmospheres. Following stent deployment, a TREK NC 2.5 x  15 mm balloon  was positioned in the mid LAD and deployed at 20 atmospheres for 22  seconds. Then an NC TREK 3.0 x 15 mm balloon was positioned in the mid  proximal and left main and deployed to a maximum pressure of 20  atmospheres. Following post-dilatation, there was acceptable angiographic  result with no residual stenosis.    An IVUS catheter was advanced and the proximal LAD to left main was  imaged. This showed the stent was well opposed. There was adequate stent  expansion and the stent appeared to extend out to the ostium of the left  main coronary artery, but no further. The guidewire was removed and final  orthogonal projections were obtained. This showed an acceptable  angiographic result. A distal 60% stenosis had not changed. The diagonal  60% ostial stenosis was unchanged as well. The patient was given 180 mg  of Brilinta and the sheaths were sutured in place.    CONCLUSION  1. Successful percutaneous coronary intervention and stenting of the  proximal to mid left anterior descending with a 2.5 x 23 mm Xience  drug-eluting stent.  2. Successful percutaneous coronary intervention and stenting of the left  main coronary artery with stent extending into the proximal left anterior  descending with 3.0 x 23 mm Xience drug-eluting stent. This was  overlapped with the proximal to mid left anterior descending stent.  4. Successful intravascular ultrasound pre- and post-stenting of the left  main coronary artery and left anterior descending.                Rhona Raider Donovon Micheletti, MD                This is an unverified document unless signed by physician.    TID:  [id number]                                      DT:  12/10/2010  5:32 P  JOB:  [id number]        DOC#:  [id number]           DD:  12/10/2010    cc:   Rhona Raider Corin Tilly, MD

## 2010-12-11 LAB — METABOLIC PANEL, BASIC
Anion gap: 10 mmol/L (ref 7–16)
BUN: 9 MG/DL (ref 8–23)
CO2: 24 MMOL/L (ref 21–32)
Calcium: 8.4 MG/DL (ref 8.3–10.4)
Chloride: 107 MMOL/L (ref 98–107)
Creatinine: 0.7 MG/DL — ABNORMAL LOW (ref 0.8–1.5)
GFR est AA: >60 mL/min/{1.73_m2} (ref >60)
GFR est non-AA: >60 mL/min/{1.73_m2} (ref >60)
Glucose: 148 MG/DL — ABNORMAL HIGH (ref 65–100)
Potassium: 4.1 MMOL/L (ref 3.5–5.1)
Sodium: 141 MMOL/L (ref 136–145)

## 2010-12-11 LAB — EKG, 12 LEAD, INITIAL
Atrial Rate: 113 {beats}/min
Atrial Rate: 96 {beats}/min
Calculated R Axis: -45 degrees
Calculated R Axis: 6 degrees
Calculated T Axis: 121 degrees
Calculated T Axis: 123 degrees
P-R Interval: 232 ms
Q-T Interval: 408 ms
Q-T Interval: 416 ms
QRS Duration: 146 ms
QRS Duration: 156 ms
QTC Calculation (Bezet): 507 ms
QTC Calculation (Bezet): 525 ms
Ventricular Rate: 93 {beats}/min
Ventricular Rate: 96 {beats}/min

## 2010-12-11 LAB — MRSA SCREEN - PCR (NASAL)

## 2010-12-11 LAB — BLOOD GAS, ARTERIAL
ALLENS TEST: POSITIVE
BASE DEFICIT: 4.1 mmol/L — ABNORMAL HIGH (ref 0–2)
BICARBONATE: 17 mmol/L — ABNORMAL LOW (ref 22.0–26.0)
FIO2: 100 %
PCO2: 23 mmHg — ABNORMAL LOW (ref 35.0–45.0)
PO2: 60 mmHg — ABNORMAL LOW (ref 75.0–100.0)
pH: 7.49 — ABNORMAL HIGH (ref 7.35–7.45)

## 2010-12-11 LAB — CBC WITH AUTOMATED DIFF
ABS. BASOPHILS: 0 10*3/uL (ref 0.0–0.2)
ABS. EOSINOPHILS: 0 10*3/uL (ref 0.0–0.8)
ABS. IMM. GRANS.: 0 10*3/uL (ref 0.0–0.5)
ABS. LYMPHOCYTES: 0.6 10*3/uL (ref 0.5–4.6)
ABS. MONOCYTES: 0.4 10*3/uL (ref 0.1–1.3)
ABS. NEUTROPHILS: 5.7 10*3/uL (ref 1.7–8.2)
BASOPHILS: 0 % (ref 0.0–2.0)
EOSINOPHILS: 0 % — ABNORMAL LOW (ref 0.5–7.8)
HCT: 32.2 % — ABNORMAL LOW (ref 41.1–50.3)
HGB: 10.4 g/dL — ABNORMAL LOW (ref 13.2–17.1)
IMMATURE GRANULOCYTES: 0.6 % (ref 0.0–5.0)
LYMPHOCYTES: 9 % — ABNORMAL LOW (ref 13–44)
MCH: 32.9 PG (ref 26.1–32.9)
MCHC: 32.3 g/dL (ref 31.4–35.0)
MCV: 101.9 FL — ABNORMAL HIGH (ref 79.6–97.8)
MONOCYTES: 6 % (ref 4.0–12.0)
MPV: 9.2 FL — ABNORMAL LOW (ref 10.8–14.1)
NEUTROPHILS: 84 % — ABNORMAL HIGH (ref 43–78)
PLATELET: 146 10*3/uL — ABNORMAL LOW (ref 150–450)
RBC: 3.16 M/uL — ABNORMAL LOW (ref 4.23–5.67)
RDW: 18.7 % — ABNORMAL HIGH (ref 11.9–14.6)
WBC: 6.8 10*3/uL (ref 4.3–11.1)

## 2010-12-11 LAB — URINALYSIS W/ REFLEX CULTURE
Bilirubin: NEGATIVE
Casts: 0 /LPF
Crystals, urine: 0 /LPF
Epithelial cells: 0 /HPF
Glucose: NEGATIVE MG/DL
Ketone: 15 MG/DL — AB
Leukocyte Esterase: NEGATIVE
Mucus: 0 /LPF
Nitrites: NEGATIVE
Protein: NEGATIVE MG/DL
Specific gravity: 1.01 (ref 1.001–1.023)
Urobilinogen: 0.2 EU/DL (ref 0.2–1.0)
WBC: 0 /HPF
pH (UA): 5.5 (ref 5.0–9.0)

## 2010-12-11 LAB — LIPID PANEL
CHOL/HDL Ratio: 2.3
Cholesterol, total: 118 MG/DL (ref <200)
HDL Cholesterol: 52 MG/DL (ref 40–60)
LDL, calculated: 42.4 MG/DL (ref <100)
Triglyceride: 118 MG/DL (ref 35–150)
VLDL, calculated: 23.6 MG/DL — ABNORMAL HIGH (ref 6.0–23.0)

## 2010-12-11 LAB — TROPONIN I
Troponin-I, Qt.: 4.04 NG/ML — CR (ref 0.02–0.05)
Troponin-I, Qt.: 0.02 NG/ML (ref 0.02–0.05)

## 2010-12-11 LAB — MAGNESIUM: Magnesium: 1.9 MG/DL (ref 1.8–2.4)

## 2010-12-11 MED ADMIN — temazepam (RESTORIL) capsule 30 mg: ORAL | @ 05:00:00 | NDC 51079041801

## 2010-12-11 MED ADMIN — furosemide (LASIX) injection 40 mg: INTRAVENOUS | @ 04:00:00 | NDC 00409610204

## 2010-12-11 MED ADMIN — morphine injection 2 mg: INTRAVENOUS | @ 03:00:00 | NDC 00409176230

## 2010-12-11 MED ADMIN — magnesium sulfate 1 g/100 ml IVPB (premix): INTRAVENOUS | @ 14:00:00 | NDC 00409672723

## 2010-12-11 MED ADMIN — latanoprost (XALATAN) 0.005 % ophthalmic solution 1 Drop: OPHTHALMIC | @ 04:00:00 | NDC 59762033302

## 2010-12-11 MED ADMIN — enoxaparin (LOVENOX) injection 40 mg: SUBCUTANEOUS | @ 13:00:00 | NDC 00075062040

## 2010-12-11 MED ADMIN — tuberculin injection 5 Units: INTRADERMAL | @ 20:00:00 | NDC 49281075221

## 2010-12-11 MED ADMIN — timolol (TIMOPTIC) 0.25% ophthalmic solution: OPHTHALMIC | @ 22:00:00 | NDC 73152003001

## 2010-12-11 MED ADMIN — timolol (TIMOPTIC) 0.25% ophthalmic solution: OPHTHALMIC | @ 20:00:00 | NDC 61314022605

## 2010-12-11 MED ADMIN — aspirin chewable tablet 81 mg: ORAL | @ 13:00:00 | NDC 63739043401

## 2010-12-11 MED ADMIN — ticagrelor (BRILINTA) tablet 90 mg: ORAL | @ 13:00:00 | NDC 00186077739

## 2010-12-11 MED ADMIN — lisinopril (PRINIVIL, ZESTRIL) tablet 10 mg: ORAL | @ 16:00:00 | NDC 68084006011

## 2010-12-11 MED ADMIN — atorvastatin (LIPITOR) tablet 40 mg: ORAL | @ 04:00:00 | NDC 68084058911

## 2010-12-11 MED ADMIN — diphenoxylate-atropine (LOMOTIL) tablet 2 Tab: ORAL | @ 23:00:00 | NDC 51079006701

## 2010-12-11 NOTE — Progress Notes (Signed)
Pt given bp meds. Pt is coughing with water and bradying to 45. Pt's sat drops but HR and sat recover immediately. Will let Dr. Lodema Hong know.

## 2010-12-11 NOTE — Progress Notes (Signed)
Pt requesting med to stop his diarrhea, however he does not have diarrhea at this time. Dr. Lodema Hong ok with Lomotil.

## 2010-12-11 NOTE — Other (Signed)
MD and RN discussed plan of care. Will probably need some type of long term care after discharge due to poor vision and disease process.

## 2010-12-11 NOTE — Progress Notes (Signed)
Pt kept pulling off CPAP so he is now on 4L HFNC. HR 85 sat 93%.

## 2010-12-11 NOTE — Progress Notes (Signed)
Report from Jan, RN

## 2010-12-11 NOTE — Progress Notes (Signed)
Darlina Rumpf, pt's girlfriend notified of room change to 335. Son informed earlier he would be moving to tele today.

## 2010-12-11 NOTE — Progress Notes (Signed)
TRANSFER - OUT REPORT:    Verbal report given to [name](name) on Gilbert Nunez  being transferred to CVICU(unit) for routine progression of care       Report consisted of patient???s Situation, Background, Assessment and   Recommendations(SBAR).     Information from the following report(s) SBAR, ED Summary, Procedure Summary, Intake/Output, MAR and Recent Results was reviewed with the receiving nurse.    Opportunity for questions and clarification was provided.

## 2010-12-11 NOTE — Progress Notes (Signed)
TRANSFER - OUT REPORT:    Verbal report given to Dewayne Hatch, RN Curt Bears  being transferred to tele for routine progression of care       Report consisted of patient???s Situation, Background, Assessment and   Recommendations(SBAR).     Information from the following report(s) SBAR was reviewed with the receiving nurse.    Opportunity for questions and clarification was provided.

## 2010-12-11 NOTE — Progress Notes (Signed)
TRANSFER - IN REPORT:    Verbal report received from [facility] RN(name) on Gilbert Nunez  being received from CCU(unit) for routine progression of care      Report consisted of patient???s Situation, Background, Assessment and   Recommendations(SBAR).     Information from the following report(s) SBAR, OR Summary and MAR was reviewed with the receiving nurse.    Opportunity for questions and clarification was provided.      Assessment completed upon patient???s arrival to unit and care assumed.

## 2010-12-11 NOTE — Progress Notes (Signed)
The patient's chart is reviewed and the patient is discussed with the staff.    Subjective:     Off BIPAP, alert, says he is okay.  Review of Systems  Pertinent items are noted in HPI.    Objective:     Recent Labs   [facility] 12-12-2010 0328 12/10/10 1255    WBC 6.8 8.3    HGB 10.4* 9.6*    HCT 32.2* 29.8*    PLT 146* 171     Recent Labs   Basename Dec 12, 2010 0330 December 12, 2010 0328 12/10/10 1930 12/10/10 1255    NA -- 141 -- 142    K -- 4.1 -- 3.2*    CL -- 107 -- 106    GLU -- 148* -- 135*    CO2 -- 24 -- 24    BUN -- 9 -- 14    CREA -- 0.7* -- 1.2    MG -- 1.9 -- --    PHOS -- -- -- --    TROIP 0.02 -- 4.04* --    INR -- -- -- 1.1     Recent Labs   Basename 12/10/10 2001    PH 7.49*    PCO2 23*    PO2 60*    HCO3 17*     BP 115/47   Pulse 96   Temp 99.3 ??F (37.4 ??C)   Resp 21   Ht 5\' 8"  (1.727 m)   Wt 63.504 kg (140 lb)   BMI 21.29 kg/m2   SpO2 91%  Physical Exam  Gen- the patient is well developed and in no distress.  HEENT- PERRL, EOMI, no scleral icterus       nose without alar flaring or epistaxis                  oral muscosa moist without cyanosis.  Neck- the neck is supple; there is no JVD.   Lungs- good BS are evident bilaterally.   Heart- RRR. There is no murmur or gallop.  Abd- soft and non-tender, unremarkable bowel sounds.  Ext- warm without cyanosis. There is no edema. Right groin cath site.  Skin- no jaundice or rashes.  Neuro- alert and oriented x 3. No gross sensorimotor deficits are present.    Lines-Foley    Drips-NTG    Chart, Labs, Medications, and X-rays have been reviewed.      Assessment:     Hospital Problems  Date Reviewed: 12-Dec-2010      Codes Class Noted POA    Pulmonary edema-cardiogenic 514  12/12/2010 No        *NSTEMI (non-ST elevated myocardial infarction)-PCI 410.70  12/10/2010 Yes        Acute respiratory failure-resolving 518.81  12/10/2010 Yes        Acute systolic heart failure 428.21  12/10/2010 Yes         Coronary atherosclerosis of artery bypass graft 414.04  12/10/2010 Yes        Complete heart block, transient 426.0  12/10/2010 Yes        LBBB (left bundle branch block) 426.3  12/10/2010 Yes        HTN (hypertension) 401.9  12/10/2010 Yes        Other and unspecified hyperlipidemia 272.4  12/10/2010 Yes        Hypoxia-better 799.02  12/10/2010 No              Plan:     To the floor? Will follow up labs and CXR. Hopefully CXR will  be clear in the AM.

## 2010-12-11 NOTE — Progress Notes (Signed)
Care rounds completed with nursing, social worker, case manager, Higher education careers adviser and patient.

## 2010-12-11 NOTE — Progress Notes (Signed)
Dr. Lodema Hong notified of pt coughing with bradycardia and o2 desaturation with cough. However, pt recovers HR and o2 sat well. Dr. Lodema Hong ok and still ok for pt to transfer to floor if needed. If he can keep the ICU bed he is ok with that too. Also ok for pt to take his immodium. Pt to stay on bedrest but can be on bathroom privledges with assist on tele.

## 2010-12-11 NOTE — Progress Notes (Signed)
TRANSFER - IN REPORT:    Verbal report received from Elizabeth(name) on Gilbert Nunez  being received from CVICU(unit) for routine progression of care      Report consisted of patient???s Situation, Background, Assessment and   Recommendations(SBAR).     Information from the following report(s) Kardex was reviewed with the receiving nurse.    Opportunity for questions and clarification was provided.      Assessment completed upon patient???s arrival to unit and care assumed.  Patient transferred into 335 at 1415 after report.  Patient on the monitor changing from Sinus Huston Foley to Sinus Tachy with pauses and a 1st degree HB.  Patient has no complaints, right groin benign, on 3 liters oxygen.  Skin otherwise intact.  Patient oriented to room.

## 2010-12-11 NOTE — Progress Notes (Signed)
Pharmacy called reguarding eye drops

## 2010-12-11 NOTE — Progress Notes (Signed)
Bedside report to Elizabeth RN, pt calm, no distress noted. VSS. Chart and labs reviewed with on-coming nurse.

## 2010-12-11 NOTE — Progress Notes (Signed)
[facility] PROGRESS NOTE           12/11/2010 8:02 AM    Admit Date: 12/10/2010      Subjective:   Sitting up in the bed and being fed breakfast.HR has improved.He appears to be mostly in a NSR with a first degree AV Block and an underlying LBBB.No recurrent prolonged pauses during the night.    ROS:  Cardiovascular:  As noted above    Objective:      Filed Vitals:    12/11/10 0417 12/11/10 0517 12/11/10 0715 12/11/10 0717   BP: 116/49 115/47  109/49   Pulse: 98 96 97 95   Temp: 99.3 ??F (37.4 ??C)      Resp: 19 21 38 30   Height:       Weight:       SpO2: 92% 91% 90% 91%       Physical Exam:  General-No Acute Distress  Neck- supple, no JVD  CV- regular rate and rhythm with a 1/6 sem  Lung- clear bilaterally  Abd- soft, nontender, nondistended  Ext- no edema bilaterally.Right radial cath site is ok.  Skin- warm and dry      Data Review:   Recent Labs   [facility] 12/11/10 0328 12/10/10 1255    NA 141 142    K 4.1 3.2*    MG 1.9 --    BUN 9 14    CREA 0.7* 1.2    GLU 148* 135*    WBC 6.8 8.3    HGB 10.4* 9.6*    HCT 32.2* 29.8*    PLT 146* 171    INR -- 1.1    TRIGL -- --    LDL -- --    HDL -- --       Assessment/Plan:     Principal Problem:   *NSTEMI (non-ST elevated myocardial infarction)-PCI (12/10/2010):No active chest pain.Active Problems:     Acute systolic heart failure (12/10/2010):Improved with PCI.Presently,he is on iv NTG.     Coronary atherosclerosis of artery bypass graft (12/10/2010)     Complete heart block, transient (12/10/2010):Hopefully will improve     LBBB (left bundle branch block) (12/10/2010)     HTN (hypertension) (12/10/2010)     Other and unspecified hyperlipidemia (12/10/2010)     Hypoxia (12/10/2010):Improved.     Pulmonary edema (12/11/2010):Clinically improved.   Plan:Taper off iv NTG.Transfer to telemetry bed.Monitor HR .Hopefully,we can avoid permanent pacemaker if HR stabilizes.Most likely ,he will need rehab after discharge.CXR in am to check resolution of pulmonary edema.Lasix prn.            Particia Nearing, MD

## 2010-12-12 LAB — METABOLIC PANEL, BASIC
Anion gap: 13 mmol/L (ref 7–16)
BUN: 21 MG/DL (ref 8–23)
CO2: 26 MMOL/L (ref 21–32)
Calcium: 7.9 MG/DL — ABNORMAL LOW (ref 8.3–10.4)
Chloride: 104 MMOL/L (ref 98–107)
Creatinine: 1.4 MG/DL (ref 0.8–1.5)
GFR est AA: >60 mL/min/{1.73_m2} (ref >60)
GFR est non-AA: 52 mL/min/{1.73_m2} — ABNORMAL LOW (ref >60)
Glucose: 132 MG/DL — ABNORMAL HIGH (ref 65–100)
Potassium: 3.6 MMOL/L (ref 3.5–5.1)
Sodium: 143 MMOL/L (ref 136–145)

## 2010-12-12 LAB — PLEASE READ & DOCUMENT PPD TEST IN 24 HRS: mm Induration: 0 mm (ref ?–0)

## 2010-12-12 LAB — MAGNESIUM: Magnesium: 2 MG/DL (ref 1.8–2.4)

## 2010-12-12 MED ADMIN — enoxaparin (LOVENOX) injection 40 mg: SUBCUTANEOUS | @ 14:00:00 | NDC 00075062040

## 2010-12-12 MED ADMIN — ticagrelor (BRILINTA) tablet 90 mg: ORAL | @ 13:00:00 | NDC 00186077760

## 2010-12-12 MED ADMIN — atorvastatin (LIPITOR) tablet 40 mg: ORAL | @ 02:00:00 | NDC 68084058911

## 2010-12-12 MED ADMIN — potassium chloride (K-DUR, KLOR-CON) SR tablet 20 mEq: ORAL | @ 23:00:00 | NDC 00048002116

## 2010-12-12 MED ADMIN — lisinopril (PRINIVIL, ZESTRIL) tablet 10 mg: ORAL | @ 13:00:00 | NDC 68084006011

## 2010-12-12 MED ADMIN — timolol (TIMOPTIC) 0.25% ophthalmic solution: OPHTHALMIC | @ 13:00:00 | NDC 61314022605

## 2010-12-12 MED ADMIN — simethicone (MYLICON) tablet 80 mg: ORAL | @ 21:00:00 | NDC 63739022510

## 2010-12-12 MED ADMIN — diphenoxylate-atropine (LOMOTIL) tablet 2 Tab: ORAL | @ 16:00:00 | NDC 51079006701

## 2010-12-12 MED ADMIN — aspirin chewable tablet 81 mg: ORAL | @ 13:00:00 | NDC 63739043401

## 2010-12-12 MED ADMIN — potassium chloride (K-DUR, KLOR-CON) SR tablet 20 mEq: ORAL | @ 21:00:00 | NDC 00245005889

## 2010-12-12 MED ADMIN — ticagrelor (BRILINTA) tablet 90 mg: ORAL | @ 02:00:00 | NDC 00186077739

## 2010-12-12 MED ADMIN — latanoprost (XALATAN) 0.005 % ophthalmic solution 1 Drop: OPHTHALMIC | @ 02:00:00 | NDC 59762033302

## 2010-12-12 MED ADMIN — amLODIPine (NORVASC) tablet 10 mg: ORAL | @ 22:00:00 | NDC 59762154005

## 2010-12-12 MED ADMIN — temazepam (RESTORIL) capsule 30 mg: ORAL | @ 04:00:00 | NDC 51079041901

## 2010-12-12 MED ADMIN — diphenoxylate-atropine (LOMOTIL) tablet 2 Tab: ORAL | @ 21:00:00 | NDC 51079006701

## 2010-12-12 MED ADMIN — diphenoxylate-atropine (LOMOTIL) tablet 2 Tab: ORAL | @ 13:00:00 | NDC 51079006701

## 2010-12-12 MED ADMIN — potassium chloride (K-DUR, KLOR-CON) SR tablet 20 mEq: ORAL | @ 16:00:00 | NDC 00245005889

## 2010-12-12 NOTE — Progress Notes (Signed)
12/12/10 1215   Oxygen Therapy   O2 Sat (%) 96 %   O2 Device Nasal cannula   O2 Flow Rate (L/min) 2 l/min   Ambulatory Sat on O2 at 2

## 2010-12-12 NOTE — Progress Notes (Signed)
Room air sat at rest 88%.

## 2010-12-12 NOTE — Progress Notes (Signed)
Bedside shift change report given to Jessica,RN (oncoming nurse) by Eric,RN (offgoing nurse).  Report given with SBAR, Kardex, Procedure Summary, MAR and Recent Results.

## 2010-12-12 NOTE — Progress Notes (Signed)
Entering chart to fix documentation. Wrongly charted restraints on this pt. However, this pt had no restraints.

## 2010-12-12 NOTE — Progress Notes (Signed)
Bedside and verbal report given to Maggie RN(oncoming nurse) by Jessica Turner, RN (outgoing nurse).

## 2010-12-12 NOTE — Progress Notes (Signed)
Problem: Mobility Impaired (Adult and Pediatric)  Goal: *Acute Goals and Plan of Care (Insert Text)  LTG:  (1.)Mr. Rubey will perform 15 repetitions of standing exercises with fingertips on counter for balance within 5 day(s).   (2.)Mr. Spickler will perform all functional transfers with MODIFIED INDEPENDENCE using the least restrictive device within 5 day(s).   (3.)Mr. Sardo will ambulate with MODIFIED INDEPENDENCE for 250 feet with cueing for avoiding objects in hallway with normal vital sign response with the least restrictive device within 5 day(s).           ACUTE PHYSICAL THERAPY ASSESSMENT NOTE   [X] Initial/Completed Assessment        [ ] Discharge       NAME/AGE/GENDER: Gilbert Nunez is Gilbert Nunez 77 y.o. male   DATE: 12/12/2010  PRIMARY DIAGNOSIS: chest pain NSTEMI (non-ST elevated myocardial infarction)           History of Present Illness: Per MD note, "The patient is an 77 year old, white male   with Gilbert Nunez remote history of coronary artery bypass grafting in the late   1980s. He reportedly had 2 vessels bypassed and was told that 2 vessels   were inoperable at this time. He has been maintained on medical therapy,   but has not had any recent ischemia evaluation. He notes this morning he   developed chest pain with associated dyspnea. He describes substernal   chest tightness. His symptoms lasted throughout the majority of the   morning and were improved with nitroglycerin. He went to the [place], where he   was directed to the emergency room. In the emergency room, he was noted   to have Gilbert Nunez left bundle branch block. This appears to be chronic from 2009.   While he was monitored on telemetry, he had Gilbert Nunez 4-beat pause with complete   heart block. He then went to Gilbert Nunez 2:1 AV block and back to Gilbert Nunez sinus rhythm.   He is currently chest pain free. His troponin was abnormal at 0.69. He   denies any history of syncope or knowledge of conduction system disease   in the past. He is largely inactive due to his vision problems. 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  He is legally blind, so friends and family drive him places.  Home Situation  Home Environment: Apartment  # Steps to Enter: 0   One/Two Story Residence: One story  Living Alone: Yes   Support Systems: Friends \\ neighbors;Child(ren)  Patient Expects to be Discharged to:: Private residence  Current DME Used/Available at Home: Cane, straight (white cane)  Interdisciplinary Collaboration: Registered Nurse and Social Worker   SUBJECTIVE   Patient stated ???I don't want to push it too much???.   Pain Intensity 1: 0         Mental Status  Neurologic State: Alert  Orientation Level: Oriented to person;Oriented to place;Oriented to situation  Cognition: Appropriate decision making  Perception: Visual (legally blind)  Perseveration: No perseveration noted  Safety/Judgement: Fall prevention;Insight into deficits      OBJECTIVE   Supine in bed hooked up to [facility] monitor with foley catheter intact on 2L O2 via NC.  Worked on gait training in hallway, monitored VS before and after gait.  Patient instructed to notify PT of any diaphoresis, nausea, discomfort above the waist, or dyspnea.     Gross Assessment: Yes (B LE's)  Gross Assessment  AROM: Generally  decreased, functional  Strength: Generally decreased, functional  Sensation: Intact                            Bed Mobility  Rolling: Independent  Supine to Sit: Independent  Scooting: Independent                     Transfers  Sit to Stand: Independent  Stand to Sit: Independent  Bed to Chair: Supervision (with cane)        Balance  Sitting: Intact  Standing: Impaired  Standing - Static: Good  Standing - Dynamic : Fair          Posture  Posture (WDL): Exceptions to WDL  Posture Assessment: Kyphosis;Forward head;Rounded shoulders    Distance (ft): 175 Feet (ft)  Ambulation - Level of Assistance: CGA  Assistive Device: Cane, straight   Base of Support: Widened  Speed/Cadence: Slow  Step Length: Left shortened;Right shortened  O2 Flow Rate (L/min): 2 l/min  Surface:level tile  Interventions: Safety awareness training;Verbal cues  Duration: 15 Minutes            Patient Vitals for the past 6 hrs:        BP BP Patient Position SpO2 O2 Flow Rate (L/min) Pulse   12/12/10 0835 164/76 mmHg - - - 84    12/12/10 1005 164/76 mmHg Sitting 98 % - 82    12/12/10 1110 - - 88 % - 80    12/12/10 1111 - - 92 % 2 l/min -   12/12/10 1140 167/72 mmHg Standing;Pre-activity - - 84    12/12/10 1155 181/79 mmHg Standing;Post activity - 2 l/min 95    12/12/10 1215 - - 96 % 2 l/min -       Neuromuscular re-education:                  EXERCISE Sets Reps Active Active Assist Passive Comments         [ ]  [ ]  [ ]            [ ]  [ ]  [ ]            [ ]  [ ]  [ ]            [ ]  [ ]  [ ]            [ ]  [ ]  [ ]            [ ]  [ ]  [ ]        In addition to today's Assessment the following treatments were rendered:  Treatment Times:              Therapeutic Exercise:                Gait Training: 15 Minutes              Therapeutic Activity:                 Neuromuscular Re-education:     Safety: good  After treatment precautions: [ ] Bed         [ ] Rails Up        [X] Chair     [ ] Essentials within Reach    [ ] Restraint in place      [X] Caregiver present  [X] RN notified    [ ] Bed Alarm/Tab Alert applied [X] Fall Risk      ASSESSMENT   Patient appears to be close to baseline level  of functional mobility.  Patient demonstrated hypertensive response to gait.  Patient would benefit from skilled Physical Therapy intervention to maximize independence with functional mobility.   PROBLEM LIST:    [ ]  Decreased Independence with Bed Mobility        [ ]  Decreased Strength  [X]  Decreased Independence with Transfers          [ ]  Decreased Range of Motion  [X]  Decreased Independence with Gait                   [X]  Decreased Balance   [ ]  Decreased Independence with Precautions       [X]  Decreased Independence with HEP  [ ]  Decreased Independence with Stairs                 [ ]  Other (comment):  ?? Patient's response to today's session was tolerated: with hypertensive response to gait.   ?? Progression toward achievement of goals: very likely to progress.   ?? Compliance with program/exercises: compliant all of the time.   ?? Recommended level of rehabilitation at time of discharge (pending progress): Home Health or None.   ?? Other Comments/Recommendations/DME: To be determined.       PLAN/INTENT FOR NEXT TREATMENT SESSION   INTERVENTIONS PLANNED:   [ ]  Bed Mobility Training                                  [X]  Patient Education  [X]  Transfer Training                                       [ ]  Modalities  [X]  Gait Training                                              [ ]  Family Training/Education  [X]  Therapeutic Exercises                              [X]  Therapeutic Activities  [ ]  Neuromuscular Re-education                    [ ]  Other (comment):    Frequency/Duration:  Continue to follow patient 1-2 times per day/4-7 days per week for until goals met to address above goals.    REHABILITATION POTENTIAL FOR STATED GOALS:  Good   Benefits and precautions of physical therapy have been discussed with the patient.    Physical Therapy Total Time of Treatment:  Patient Vitals for the past 8 hrs:        Time In Time Out   12/12/10 1140 1140 1204     Thank you for this referral.  [place] Ananias Pilgrim, PT

## 2010-12-12 NOTE — Progress Notes (Signed)
12/12/10 1110   Oxygen Therapy   Pulse (Heart Rate) 80    O2 Sat (%) ! 88 %   O2 Device Room air    At rest

## 2010-12-12 NOTE — Progress Notes (Signed)
Potassium 3.6. Potassium replacement ordered per protocol.

## 2010-12-12 NOTE — Progress Notes (Signed)
The patient's chart is reviewed and the patient is discussed with the staff.    Subjective:     Walking from the bathroom, says he is okay.  Review of Systems  Pertinent items are noted in HPI.    Objective:     Recent Labs   [facility] 2010-12-13 0328 12/10/10 1255    WBC 6.8 8.3    HGB 10.4* 9.6*    HCT 32.2* 29.8*    PLT 146* 171     Recent Labs   Basename 12/12/10 0529 12-13-10 0330 13-Dec-2010 0328 12/10/10 1930 12/10/10 1255    NA 143 -- 141 -- 142    K 3.6 -- 4.1 -- 3.2*    CL 104 -- 107 -- 106    GLU 132* -- 148* -- 135*    CO2 26 -- 24 -- 24    BUN 21 -- 9 -- 14    CREA 1.4 -- 0.7* -- 1.2    MG 2.0 -- 1.9 -- --    PHOS -- -- -- -- --    TROIP -- 0.02 -- 4.04* --    INR -- -- -- -- 1.1     Recent Labs   Basename 12/10/10 2001    PH 7.49*    PCO2 23*    PO2 60*    HCO3 17*     BP 164/76   Pulse 84   Temp 98.2 ??F (36.8 ??C)   Resp 19   Ht 5\' 8"  (1.727 m)   Wt 64.864 kg (143 lb)   BMI 21.74 kg/m2   SpO2 92%  Physical Exam  Gen- the patient is well developed and in no distress.  HEENT- PERRL, EOMI, no scleral icterus       nose without alar flaring or epistaxis                  oral muscosa moist without cyanosis.  Neck- the neck is supple; there is no JVD.   Lungs- few crackles are evident bilaterally.   Heart- Heart sounds are distant.  Abd- soft and non-tender, unremarkable bowel sounds.  Ext- warm without cyanosis. There is no edema.  Skin- no jaundice or rashes.  Neuro- alert and mostly oriented. DebilitatedNo gross sensorimotor deficits are present.    Chart, Labs, Medications, and X-rays have been reviewed.  CXR- Much clearer, small effusions.      Assessment:     Hospital Problems  Date Reviewed: 12/13/10      Codes Class Noted POA    Pulmonary edema-resolving. 514  12-13-2010 No        *NSTEMI (non-ST elevated myocardial infarction)-PCI 410.70  12/10/2010 Yes        Acute systolic heart failure-better. 428.21  12/10/2010 Yes         Coronary atherosclerosis of artery bypass graft 414.04  12/10/2010 Yes        Complete heart block, transient 426.0  12/10/2010 Yes        LBBB (left bundle branch block) 426.3  12/10/2010 Yes        HTN (hypertension) 401.9  12/10/2010 Yes        Other and unspecified hyperlipidemia 272.4  12/10/2010 Yes        Hypoxia-resolving. 799.02  12/10/2010 No              Plan:     Discussed with cardiology. Marland Kitchendetermine oxygen needs. Respiratory problems are stabilizing and critical care needs are resolving. Will sign off, call for problems.  Thanks.

## 2010-12-12 NOTE — Progress Notes (Signed)
12/12/2010 7:56 AM    Admit Date: 12/10/2010    Admit Diagnosis: chest pain      Subjective:   No cp or sob      Objective:    BP 142/61   Pulse 87   Temp 98.2 ??F (36.8 ??C)   Resp 19   Ht 5\' 8"  (1.727 m)   Wt 64.864 kg (143 lb)   BMI 21.74 kg/m2   SpO2 92%    Physical Exam:  General-Well Developed, Well Nourished, No Acute Distress, Alert & Oriented x 3, appropriate mood.  Neck- supple, no JVD  CV- regular rate and rhythm no MRG  Lung- clear bilaterally  Abd- soft, nontender, nondistended  Ext- no edema bilaterally.  Skin- warm and dry        Data Review:   Recent Labs   Basename 12/12/10 0529 12/11/10 0328 12/10/10 1255    NA 143 -- --    K 3.6 -- --    BUN 21 -- --    CREA 1.4 -- --    WBC -- 6.8 --    HGB -- 10.4* --    HCT -- 32.2* --    PLT -- 146* --    INR -- -- 1.1       Assessment/Plan:     Principal Problem:   *NSTEMI (non-ST elevated myocardial infarction)-PCI (12/10/2010)Stable. Continue current medical therapy.  NO beta blocker due bradycardia and pauses  Active Problems:     Acute systolic heart failure (12/10/2010)Improved with current therapy. Will continue medications   Coronary atherosclerosis of artery bypass graft (12/10/2010)     Complete heart block, transient (12/10/2010)     LBBB (left bundle branch block) (12/10/2010)     HTN (hypertension) (12/10/2010)Stable. Continue current medical therapy.   Other and unspecified hyperlipidemia (12/10/2010)  ARF- cr double. Monitor and hold diuretics.   Hypoxia (12/10/2010)     Pulmonary edema (12/11/2010)Improved with current therapy. Will continue medications

## 2010-12-12 NOTE — Progress Notes (Signed)
12/12/10 1111   Oxygen Therapy   O2 Sat (%) 92 %   O2 Device Nasal cannula   O2 Flow Rate (L/min) 2 l/min

## 2010-12-12 NOTE — Progress Notes (Signed)
12/12/10 1215   Oxygen Therapy   O2 Sat (%) 96 %   O2 Device Nasal cannula   O2 Flow Rate (L/min) 2 l/min    Sat 88% on room air at rest.Sat 92% on O2 at 2 at rest.Ambulatory sat on 2 lpm 96%

## 2010-12-13 LAB — METABOLIC PANEL, BASIC
Anion gap: 8 mmol/L (ref 7–16)
BUN: 19 MG/DL (ref 8–23)
CO2: 27 MMOL/L (ref 21–32)
Calcium: 7.8 MG/DL — ABNORMAL LOW (ref 8.3–10.4)
Chloride: 107 MMOL/L (ref 98–107)
Creatinine: 1.4 MG/DL (ref 0.8–1.5)
GFR est AA: >60 mL/min/{1.73_m2} (ref >60)
GFR est non-AA: 52 mL/min/{1.73_m2} — ABNORMAL LOW (ref >60)
Glucose: 130 MG/DL — ABNORMAL HIGH (ref 65–100)
Potassium: 3.2 MMOL/L — ABNORMAL LOW (ref 3.5–5.1)
Sodium: 142 MMOL/L (ref 136–145)

## 2010-12-13 LAB — BNP: BNP: 277 pg/mL

## 2010-12-13 MED ORDER — NITROGLYCERIN 0.4 MG SUBLINGUAL TAB
0.4 mg | SUBLINGUAL | Status: AC | PRN
Start: 2010-12-13 — End: ?

## 2010-12-13 MED ORDER — TICAGRELOR 90 MG TAB
90 mg | ORAL_TABLET | Freq: Two times a day (BID) | ORAL | Status: DC
Start: 2010-12-13 — End: 2011-07-23

## 2010-12-13 MED ORDER — ATORVASTATIN 40 MG TAB
40 mg | ORAL_TABLET | Freq: Every evening | ORAL | Status: DC
Start: 2010-12-13 — End: 2011-07-23

## 2010-12-13 MED ORDER — LISINOPRIL 10 MG TAB
10 mg | ORAL_TABLET | Freq: Every day | ORAL | Status: DC
Start: 2010-12-13 — End: 2011-07-23

## 2010-12-13 MED ORDER — HYDROCHLOROTHIAZIDE 12.5 MG CAP
12.5 mg | ORAL_CAPSULE | Freq: Every day | ORAL | Status: DC
Start: 2010-12-13 — End: 2011-07-23

## 2010-12-13 MED ORDER — ASPIRIN 81 MG CHEWABLE TAB
81 mg | ORAL_TABLET | Freq: Every day | ORAL | Status: AC
Start: 2010-12-13 — End: ?

## 2010-12-13 MED ORDER — POTASSIUM CHLORIDE SR 20 MEQ TAB, PARTICLES/CRYSTALS
20 mEq | ORAL_TABLET | Freq: Every day | ORAL | Status: DC
Start: 2010-12-13 — End: 2011-07-23

## 2010-12-13 MED ADMIN — temazepam (RESTORIL) capsule 30 mg: ORAL | @ 04:00:00 | NDC 51079041901

## 2010-12-13 MED ADMIN — diphenoxylate-atropine (LOMOTIL) tablet 2 Tab: ORAL | @ 16:00:00 | NDC 51079006701

## 2010-12-13 MED ADMIN — timolol (TIMOPTIC) 0.25% ophthalmic solution: OPHTHALMIC | NDC 61314022605

## 2010-12-13 MED ADMIN — ticagrelor (BRILINTA) tablet 90 mg: ORAL | @ 13:00:00 | NDC 00186077760

## 2010-12-13 MED ADMIN — diphenoxylate-atropine (LOMOTIL) tablet 2 Tab: ORAL | NDC 51079006701

## 2010-12-13 MED ADMIN — diphenhydrAMINE (BENADRYL) capsule 25 mg: ORAL | NDC 00904530661

## 2010-12-13 MED ADMIN — ticagrelor (BRILINTA) tablet 90 mg: ORAL | NDC 00186077760

## 2010-12-13 MED ADMIN — latanoprost (XALATAN) 0.005 % ophthalmic solution 1 Drop: OPHTHALMIC | NDC 59762033302

## 2010-12-13 MED ADMIN — diphenoxylate-atropine (LOMOTIL) tablet 2 Tab: ORAL | @ 13:00:00 | NDC 51079006701

## 2010-12-13 MED ADMIN — atorvastatin (LIPITOR) tablet 40 mg: ORAL | NDC 68084058911

## 2010-12-13 MED ADMIN — lisinopril (PRINIVIL, ZESTRIL) tablet 10 mg: ORAL | @ 13:00:00 | NDC 68084006011

## 2010-12-13 MED ADMIN — aspirin chewable tablet 81 mg: ORAL | @ 13:00:00 | NDC 63739043401

## 2010-12-13 MED ADMIN — potassium chloride (K-DUR, KLOR-CON) SR tablet 40 mEq: ORAL | @ 16:00:00 | NDC 00245005889

## 2010-12-13 MED ADMIN — amLODIPine (NORVASC) tablet 10 mg: ORAL | @ 13:00:00 | NDC 59762154005

## 2010-12-13 MED ADMIN — potassium chloride (K-DUR, KLOR-CON) SR tablet 40 mEq: ORAL | @ 13:00:00 | NDC 00245005889

## 2010-12-13 MED ADMIN — atorvastatin (LIPITOR) tablet 40 mg: ORAL | @ 02:00:00 | NDC 82804002690

## 2010-12-13 MED ADMIN — latanoprost (XALATAN) 0.005 % ophthalmic solution 1 Drop: OPHTHALMIC | @ 02:00:00 | NDC 71205015425

## 2010-12-13 NOTE — Progress Notes (Signed)
Removed electrodes and returned monitor to secretary. Removed IVs-sites benign. Reviewed discharge instructions with patient. Patient verbalized understanding of medication compliance and activity limitations. Patient preparing to dress and discharge home.

## 2010-12-13 NOTE — Progress Notes (Signed)
12/13/2010 7:56 AM    Admit Date: 12/10/2010    Admit Diagnosis: chest pain      Subjective:   No cp or sob      Objective:    BP 172/77   Pulse 98   Temp 98.2 ??F (36.8 ??C)   Resp 18   Ht 5\' 8"  (1.727 m)   Wt 62.823 kg (138 lb 8 oz)   BMI 21.06 kg/m2   SpO2 97%    Physical Exam:  General-Well Developed, Well Nourished, No Acute Distress, Alert & Oriented x 3, appropriate mood.  Neck- supple, no JVD  CV- regular rate and rhythm no MRG  Lung- clear bilaterally  Abd- soft, nontender, nondistended  Ext- no edema bilaterally.  Skin- warm and dry        Data Review:   Recent Labs   Basename 12/13/10 0510 12/11/10 0328 12/10/10 1255    NA 142 -- --    K 3.2* -- --    BUN 19 -- --    CREA 1.4 -- --    WBC -- 6.8 --    HGB -- 10.4* --    HCT -- 32.2* --    PLT -- 146* --    INR -- -- 1.1       Assessment/Plan:     Principal Problem:   *NSTEMI (non-ST elevated myocardial infarction)-PCI (12/10/2010)Stable. Continue current medical therapy.  NO beta blocker due bradycardia and pauses which persist  Active Problems:     Acute systolic heart failure (12/10/2010)Improved with current therapy. Will continue medications   Coronary atherosclerosis of artery bypass graft (12/10/2010)     Complete heart block, transient (12/10/2010)     LBBB (left bundle branch block) (12/10/2010)     HTN (hypertension) (12/10/2010)better. Add back HCTZ.   Other and unspecified hyperlipidemia (12/10/2010)  ARF- cr better and add back hctz.   Hypoxia (12/10/2010)wean O2. BNP ok. Pulmonary to assess possible home O2 needs. OK for dc with [place]     Pulmonary edema (12/11/2010)Improved with current therapy. Will continue medications

## 2010-12-13 NOTE — Progress Notes (Signed)
Patient remains on o2 and is somewhat SOB. Overnight oxim study not done at this time.

## 2010-12-13 NOTE — Progress Notes (Addendum)
[place]     Pt. Last Name: [facility]       Pt. First Name: Gilbert Nunez [address]   MR#: [id number] / Admit#: [id number]   [place], [place] [address]    DOB: 1928-04-19 / Age: 77  Attn.: Nessmith, Rhona Raider.  Location: [id number] [phone] [id number]        Case Management - Progress Note  Initial Open Date: 12/12/2010   Case Manager: Enzo Bi, RN,CCM    Initial Open Date: 12/12/2010  Social Worker: Gilbert Nunez, LMSW    Expected Date of Discharge:   Transferred From: Home  ECF Bed Held Until:   Bed Held By:     Power of Attorney:   POA/Guardian/Conservator Capacity:    Primary Caregiver: self, Gilbert Nunez - son (C# [phone])  Living Arrangements: Own Home    Source of Income: Retirement  Payee:   Psychosocial History:   Cultural/Religious/Language Issues:   Education Level:   ADLS/Current Living Arrangements Issues: Patient lives alone in own apartment   in senior housing Gilbert Nunez) - has been independent with all ADLS PTA - is   legally blind.    Past Providers: Has white cane to assistwith ambulation. No prior use of HH   services.    Will patient perform self care at discharge? Y    Anticipated Discharge Disposition Goal: Home with Home Health Care    Assessment/Plan:        12/13/2010 12:37P  Patient is ready for discharge home today - has been   referred to [facility] for skilled nursing, PT and OT services - skilled nursing   will also be doing patient's lab sraw. SW, at patient's request, contacted son   and left message re: f/u cardiology appt scheduled for 9/5 at 3:30 PM.   Patient states that he is currently having some transportation issues and   might need some assist from son in getting to this appt - SW communicated this   to son. Patient has arranged his own transportation home through a friend and   will also have assist in getting new RXs filled. Roselee Nova [place], LMSW      12/12/2010 04:06P Patient is n 77 y/o WWM admitted with a non-STEMI    complicated by a Left BBB - patient currently lives in own apartment and has   been indpendent with all ADLS PTA - has much assist from other neighbors in   senior building where he lives - they will often drive him to MD appts and   provide transportation as needed. Patient is legally blind and uses a cane to   ambulate - no other use of HH services in the past.He also accesses some   services at the [facility] clinic - and SW there Gilbert Nunez, Gilbert Nunez   [phone]) has already assisted patient with application process for Aid   and Assistance program through the [place]. Patient would like to be discharged   home with [facility] PT/OT as PT recommendations have stated - he has already elected   [facility] to be the provider. -Kem Kays, LMSW              Resources at Discharge:   Home Health Referral        Service Providers at Discharge:  [facility]

## 2010-12-13 NOTE — Progress Notes (Signed)
Verbal bedside report given to oncoming RN, Jessica. Patient's situation, background, assessment and recommendations provided. Opportunity for questions provided. Oncoming RN assumed care of patient.

## 2010-12-13 NOTE — Progress Notes (Signed)
Ambulating O2 sat 95-97% on room air.

## 2010-12-13 NOTE — Progress Notes (Addendum)
Problem: Self Care Deficits Care Plan (Adult)  Goal: *Acute Goals and Plan of Care (Insert Text)  1. Pt will be modified independent with shower transfer and bathing with good safety awareness in 1 week  2. Pt will be modified independent with self set up ADL with good dynamic standing balance in 1 week  3. Pt will be modified independent with simple home mgm???t tasks with good safety awareness in 1 week     ACUTE OCCUPATIONAL THERAPY ASSESSMENT NOTE   [X]  Initial Assessment             [ ]  7th Visit                    [ ]  Discharge     NAME/AGE/GENDER: Gilbert Nunez is a 77 y.o. male  DATE: 12/13/2010  PRIMARY DIAGNOSIS:chest pain NSTEMI (non-ST elevated myocardial infarction)            History of Present Illness: The patient is an 77 year old, white male   with a remote history of coronary artery bypass grafting in the late   1980s. He reportedly had 2 vessels bypassed and was told that 2 vessels   were inoperable at this time. He has been maintained on medical therapy,   but has not had any recent ischemia evaluation. He notes this morning he   developed chest pain with associated dyspnea. He describes substernal   chest tightness. His symptoms lasted throughout the majority of the   morning and were improved with nitroglycerin. He went to the [place], where he   was directed to the emergency room. In the emergency room, he was noted   to have a left bundle branch block. This appears to be chronic from 2009.   While he was monitored on telemetry, he had a 4-beat pause with complete   heart block. He then went to a 2:1 AV block and back to a sinus rhythm.   He is currently chest pain free. His troponin was abnormal at 0.69. He   denies any history of syncope or knowledge of conduction system disease   in the past. He is largely inactive due to his vision problems. He has   had a retinal detachment in the past.     Past Medical History   Diagnosis Date   ??? Actinic keratitis     ??? Opioid dependence      ??? Senile macular degeneration     ??? Glaucoma     ??? Male erectile disorder     ??? Anemia     ??? Vitamin B 12 deficiency     ??? Crohn disease     ??? Osteoarthritis     ??? Coronary artery disease        Past Surgical History   Procedure Date   ??? Hx cholecystectomy     ??? Hx small bowel resection     ??? Pr left heart cath,percutaneous 12/10/2010       Xience to LAD and left main    Patient Active Problem List   Diagnoses Code   ??? NSTEMI (non-ST elevated myocardial infarction)-PCI 410.70   ??? Acute systolic heart failure 428.21   ??? Coronary atherosclerosis of artery bypass graft 414.04   ??? Complete heart block, transient 426.0   ??? LBBB (left bundle branch block) 426.3   ??? HTN (hypertension) 401.9   ??? Other and unspecified hyperlipidemia 272.4   ??? Hypoxia 799.02   ??? Pulmonary edema 514  Prior Level of Function/Home Situation: Pt was living at a [employer].  Pt ambulates using cane & modified independent with self care.  Neighbors drive pt to buy groceries and MD appointment.  Pt heat up food in microwave for meals and has assistance in cleaning & laundry.  Home Situation  Home Environment: Private residence  # Steps to Enter: 0   One/Two Story Residence: One story  Living Alone: Yes   Support Systems: Friends \\ neighbors  Patient Expects to be Discharged to:: Apartment  Current DME Used/Available at Home: Cane, straight;Grab bars;Shower chair  Tub or Shower Type: Web designer Collaboration: Registered Nurse  SUBJECTIVE   Patient stated ???What time are they going to let me go home????.    OBJECTIVE     Pain Intensity 1: 0          Hand Dominance: right handed    Bed Mobility  Rolling: Independent  Supine to Sit: Independent  Sit to Supine: Independent  Scooting: Independent    Functional Transfers  Sit to Stand: Supervision  Stand to Sit: Supervision  Shower Transfer: Stand-by assistance    Balance  Sitting: Intact  Standing: Impaired  Standing - Static: Good  Standing - Dynamic :  (fair+)        Patient Vitals for the past 6 hrs:    BP BP Patient Position SpO2 O2 Flow Rate (L/min) Pulse   12/13/10 0850 172/77 mmHg Sitting 97 % 2.5 l/min 98        Braces/Orthotics: none    Neuromuscular Re-education:            Gross Assessment: Yes  Gross Assessment  AROM: Generally decreased, functional  Strength: Generally decreased, functional  Sensation: Intact                      Coordination  Fine Motor Skills-Upper: Left Intact;Right Intact  Gross Motor Skills-Upper: Left Intact;Right Intact           Vision  Tracking: Able to track stimulus in all quadrants w/o difficulty (pt legally blind)  Diplopia: Yes  Acuity:  (legally blind)    Mental Status  Neurologic State: Alert  Orientation Level: Oriented X4  Cognition: Appropriate decision making;Appropriate for age attention/concentration  Perception: Appears intact  Perseveration: No perseveration noted  Safety/Judgement: Awareness of environment    ADLs from General Assessment:  Basic ADL   Feeding: Supervision/set up  Oral Facial Hygiene/Grooming: Supervision/set up  Bathing: Stand-by assistance  Upper Body Dressing: Supervision/set up  Lower Body Dressing: Stand-by assistance  Toileting: Stand by assistance    Instrumental ADL  Homemaking: Minimum assistance     In addition to today's Assessment the following treatments were rendered: Pt donned UB and socks with set up, ambulate in room using cane with SBA & transfer to walk in shower with SBA.  Pt retrieve clothes from closet with cues for safety, transport water container from 1 place to another with SBA and straighten bed with SBA.  Pt completed task without fatigue.  Discuss home modification to decrease risk of fall and pain at back.  Treatment Times:              Therapeutic Exercise: 0 minutes              Self Care Training: 18 minutes              Therapeutic Activity: 0 minutes  Neuromuscular Re-education: 0 minutes              Other: evaluation  Safety:     After treatment precautions: [ ]  Bed            [ ]  Rails Up        [ ]  Chair     [ ]  Essentials within Reach    [ ]  Restraint in place      [ ]  Caregiver present  [ ]  RN notified             [ ]  Bed Alarm/Tab Alert applied            ASSESSMENT    Patient presented with the below deficits and would benefit from OT services to address the stated goals for increasing independence and safety with ADL and functional mobility.     PROBLEM LIST:              [X]  Decreased independence with self care              [X]  Decreased independence with mobility in ADLs              [ ]  Decreased Range of Motion              [ ]  Decreased Strength              [X]  Decreased Balance              [ ]  Decreased Activity Tolerance              [X]  Decreased Safety Awareness              [ ]  Decreased Orientation              [ ]  Decreased Awareness/Neglect  [ ]  Other (Comment):       PLAN / INTENT FOR NEXT TREATMENT SESSION     INTERVENTIONS PLANNED:  [X]  Self Care Training                 [X]  Therapeutic Activities  [X]  Functional Mobility Training  [ ]  Cognitive Retraining  [X]  Therapeutic Exercises         [ ]  Endurance Activities  [X]  Balance Training                   [X]  Neuromuscular Re-education  [X]  Patient Education                 [ ]  Family Training/Education  [ ]  Visual/Perceptual Training     [ ]  Other (Comment):    Frequency/Duration:  Continue to follow patient daily to BID for  duration of hospital stay to address above goals.    REHABILITATION POTENTIAL FOR STATED GOALS:  Good   Benefits and precautions of occupational therapy have been discussed with the patient.      ?? Patient???s response to today???s session was: tolerated well with no complications   ?? Compliance with program/exercises: compliant all of the time.   ?? Recommended level of rehabilitation at time of discharge (pending progress): Home Health         ?? Other Comments/Recommendations/DME: Cane, Type: Single [employer]     OT Patient Time In/Time Out   Time In: 1011  Time Out: 1034  Thank you for this referral.  Illa Level, OT

## 2010-12-13 NOTE — Discharge Summary (Addendum)
[facility] Discharge Summary     Patient ID:  Gilbert Nunez  [id number]  77 y.o.  04/17/1928    Admit date: 12/10/2010    Discharge date and time:  12-13-2010    Admitting Physician: Joelene Millin, MD     Discharge Physician: Lucy Chris, PA/Dr. Myra Rude    Admission Diagnoses: chest pain    Discharge Diagnoses:   Patient Active Problem List   Diagnoses Date Noted   ??? Pulmonary edema 12/11/2010   ??? NSTEMI (non-ST elevated myocardial infarction)-PCI 12/10/2010   ??? Acute systolic heart failure 12/10/2010   ??? Coronary atherosclerosis of artery bypass graft 12/10/2010   ??? Complete heart block, transient 12/10/2010   ??? LBBB (left bundle branch block) 12/10/2010   ??? HTN (hypertension) 12/10/2010   ??? Other and unspecified hyperlipidemia 12/10/2010   ??? Hypoxia 12/10/2010       Cardiology Procedures this admission:  Left heart catheterization with PCI  Consults: Pulmonary     Hospital Course: Pt was admitted with complaints of chest pain. He had an EKG showed left bundle branch block with sinus rhythm at 89 beats minute. Type 1 second-degree AV block. Wenckebach. Review of telemetry showed episode of complete heart block. This lasted approximately 4 seconds. Then it was followed by 2:1 AV block.  It was felt that he should be taken for a LHC emergently. Pt was taken to the cath lab by Dr. Burman Riis on 12/10/10. Pt was found to have a subtotally occluded left main and an 90-95% LAD stenosis that were stented with a 2.5 x 23 Xience DES to LM and a 3.0 x 23mm Xience DES with 0% residual stenosis. Pt tolerated the procedure well and was taken to the ICU floor for recovery. Pulmonary/Critical Care was consulted. He was on BiPAP in the ICU to assist with his respiratory status. His respiratory status improved and he was transferred out of the ICU to the telemetry floor. His respiratory status improved and he was transferred to the telemetry floor. He was monitored closely. He was seen by PT and it was recommended that he go home with home health/PT. Today Pt was up feeling well without any complaints of CP, SOB or palpitations. Pt's right groin cath site was clean, dry and intact without hematoma or bruit. Pt's labs were WNL. Pt was seen and examined by Dr. Myra Rude and determined stable and ready for discharge. Pt was instructed on the importance of taking aspirin and Brilinta everyday without missing a dose. He had some bradycardia and he will not be discharged home on a beta blocker secondary to this.      DISPOSITION: The patient is being discharged home in stable condition on a low saturated fat, low cholesterol and low salt diet. Pt is instructed to advance activities as tolerated limited to fatigue or shortness of breath. Pt is instructed to do no heavy lifting, straining, stooping or squatting for 7 days. Pt is instructed to watch cath site for bleeding/oozing, if seen Pt is instructed to apply firm pressure with clean cloth and call office at [phone]. Pt is instructed to watch for signs of infection which include increasing area of redness around site, fever/hot to touch or purulent drainage. Pt is instructed not to soak in a tub bath for 1 week, but it is okay to shower. Pt is instructed to call office or return to ER for immediate evaluation of any shortness of breath or chest pain not relieved by NTG.  Pt is to follow up with Dr. Theressa Millard on September 5th @ 3:30pm   Pt is to follow up with PCP in 4 weeks  Pt is being referred to out patient cardiac rehab.     Discharge Exam: BP 172/77   Pulse 98   Temp 98.2 ??F (36.8 ??C)   Resp 18   Ht 5\' 8"  (1.727 m)   Wt 62.823 kg (138 lb 8 oz)   BMI 21.06 kg/m2   SpO2 97%  BP 172/77   Pulse 98   Temp 98.2 ??F (36.8 ??C)   Resp 18   Ht 5\' 8"  (1.727 m)   Wt 62.823 kg (138 lb 8 oz)   BMI 21.06 kg/m2   SpO2 97%  General Appearance:  Well developed, well nourished,alert and oriented x 3, and individual in no acute distress.   Ears/Nose/Mouth/Throat:   Hearing grossly normal.         Neck: Supple.   Chest:   Lungs clear to auscultation bilaterally.   Cardiovascular:  Regular rate and rhythm, S1, S2 normal, no murmur.   Abdomen:   Soft, non-tender, bowel sounds are active.   Extremities: No edema bilaterally.    Skin: Warm and dry.               Recent Results (from the past 24 hour(s))   PLEASE READ & DOCUMENT PPD TEST IN 24 HRS    Collection Time    12/12/10  5:01 PM       Component Value Range    PPD    High:Negative    mm Induration 0  High:0(mm)   BNP    Collection Time     12/13/10  5:10 AM       Component Value Range    BNP 277     METABOLIC PANEL, BASIC    Collection Time    12/13/10  5:10 AM       Component Value Range    Sodium 142  136 - 145 (MMOL/L)    Potassium 3.2 (*) 3.5 - 5.1 (MMOL/L)    Chloride 107  98 - 107 (MMOL/L)    CO2 27  21 - 32 (MMOL/L)    Anion gap 8  7 - 16 (mmol/L)    Glucose 130 (*) 65 - 100 (MG/DL)    BUN 19  8 - 23 (MG/DL)    Creatinine 1.4  0.8 - 1.5 (MG/DL)    GFR est AA >14  >78 (ml/min/1.2m2)    GFR est non-AA 52 (*) >60 (ml/min/1.75m2)    Calcium 7.8 (*) 8.3 - 10.4 (MG/DL)         Current Discharge Medication List      START taking these medications    Details   aspirin 81 mg chewable tablet Take 1 Tab by mouth daily.  Qty: 30 Tab, Refills: 11      atorvastatin (LIPITOR) 40 mg tablet Take 1 Tab by mouth nightly.  Qty: 30 Tab, Refills: 11      nitroglycerin (NITROSTAT) 0.4 mg SL tablet 1 Tab by SubLINGual route every five (5) minutes as needed for Chest Pain.  Qty: 1 Bottle, Refills: 5      potassium chloride (K-DUR, KLOR-CON) 20 mEq tablet Take 1 Tab by mouth daily.  Qty: 30 Tab, Refills: 11      ticagrelor (BRILINTA) 90 mg tablet Take 1 Tab by mouth two (2) times a day.  Qty: 60 Tab, Refills: 11  CONTINUE these medications which have CHANGED    Details   hydrochlorothiazide (MICROZIDE) 12.5 mg capsule Take 2 Caps by mouth daily.  Qty: 60 Cap, Refills: 11      lisinopril (PRINIVIL, ZESTRIL) 10 mg tablet Take 1 Tab by mouth daily.  Qty: 30 Tab, Refills: 11         CONTINUE these medications which have NOT CHANGED    Details   amLODIPine (NORVASC) 10 mg tablet Take 10 mg by mouth daily.        acetaminophen-codeine (TYLENOL #3) 300-30 mg per tablet Take 1 Tab by mouth every six (6) hours as needed.        latanoprost (XALATAN) 0.005 % ophthalmic solution Administer 1 Drop to both eyes nightly.        temazepam (RESTORIL) 30 mg capsule Take 30 mg by mouth nightly as needed.         timolol (TIMOPTIC) 0.25 % ophthalmic solution Administer 1 Drop to both eyes two (2) times a day.               Signed:  Lucy Chris, PA  12/13/2010  10:13 AM

## 2010-12-26 LAB — METABOLIC PANEL, BASIC
Anion gap: 7 mmol/L (ref 7–16)
BUN: 35 MG/DL — ABNORMAL HIGH (ref 8–23)
CO2: 26 MMOL/L (ref 23–32)
Calcium: 8.4 MG/DL (ref 8.3–10.4)
Chloride: 101 MMOL/L (ref 98–107)
Creatinine: 1.8 MG/DL — ABNORMAL HIGH (ref 0.8–1.5)
GFR est AA: 47 mL/min/{1.73_m2} — ABNORMAL LOW (ref >60)
GFR est non-AA: 39 mL/min/{1.73_m2} — ABNORMAL LOW (ref >60)
Glucose: 117 MG/DL — ABNORMAL HIGH (ref 65–100)
Potassium: 4.4 MMOL/L (ref 3.5–5.1)
Sodium: 134 MMOL/L — ABNORMAL LOW (ref 136–145)

## 2011-07-23 ENCOUNTER — Inpatient Hospital Stay
Admit: 2011-07-23 | Discharge: 2011-07-27 | Disposition: A | Discharge status: Home / Self Care | Source: Home / Self Care | Attending: Cardiovascular Disease | Admitting: Cardiovascular Disease

## 2011-07-23 DIAGNOSIS — I214 Non-ST elevation (NSTEMI) myocardial infarction: Secondary | ICD-10-CM

## 2011-07-23 LAB — BLOOD GAS, ARTERIAL
ALLENS TEST: POSITIVE
Arterial O2 Hgb: 98.2 % — ABNORMAL HIGH (ref 94.0–97.0)
BASE EXCESS: 3.9 mmol/L — ABNORMAL HIGH (ref 0–3)
BICARBONATE: 28 mmol/L — ABNORMAL HIGH (ref 22.0–26.0)
CARBOXYHEMOGLOBIN: 0.3 % — ABNORMAL LOW (ref 0.5–1.5)
DEOXYHEMOGLOBIN: 1 % (ref 0.0–5.0)
FIO2: 100 %
METHEMOGLOBIN: 0.6 % (ref 0.0–1.5)
O2 SAT: 99 % — ABNORMAL HIGH (ref 92.0–98.5)
PCO2: 38 mmHg (ref 35.0–45.0)
PO2: 278 mmHg — CR (ref 75.0–100.0)
TOTAL HEMOGLOBIN: 9.6 GM/DL — ABNORMAL LOW (ref 11.7–15.0)
pH: 7.48 — ABNORMAL HIGH (ref 7.35–7.45)

## 2011-07-23 LAB — CBC WITH AUTOMATED DIFF
ABS. BASOPHILS: 0 10*3/uL (ref 0.0–0.2)
ABS. EOSINOPHILS: 0 10*3/uL (ref 0.0–0.8)
ABS. IMM. GRANS.: 0 10*3/uL (ref 0.0–0.5)
ABS. LYMPHOCYTES: 0.7 10*3/uL (ref 0.5–4.6)
ABS. MONOCYTES: 0.7 10*3/uL (ref 0.1–1.3)
ABS. NEUTROPHILS: 7 10*3/uL (ref 1.7–8.2)
BASOPHILS: 0 % (ref 0.0–2.0)
EOSINOPHILS: 0 % — ABNORMAL LOW (ref 0.5–7.8)
HCT: 30.6 % — ABNORMAL LOW (ref 41.1–50.3)
HGB: 9.7 g/dL — ABNORMAL LOW (ref 13.2–17.1)
IMMATURE GRANULOCYTES: 0 % (ref 0.0–5.0)
LYMPHOCYTES: 8 % — ABNORMAL LOW (ref 13–44)
MCH: 20.5 PG — ABNORMAL LOW (ref 26.1–32.9)
MCHC: 31.7 g/dL (ref 31.4–35.0)
MCV: 64.7 FL — ABNORMAL LOW (ref 79.6–97.8)
MONOCYTES: 8 % (ref 4.0–12.0)
MPV: 10.5 FL — ABNORMAL LOW (ref 10.8–14.1)
NEUTROPHILS: 84 % — ABNORMAL HIGH (ref 43–78)
PLATELET COMMENTS: ADEQUATE
PLATELET: 184 10*3/uL (ref 150–450)
RBC: 4.73 M/uL (ref 4.23–5.67)
RDW: 15 % — ABNORMAL HIGH (ref 11.9–14.6)
WBC: 8.4 10*3/uL (ref 4.3–11.1)

## 2011-07-23 LAB — METABOLIC PANEL, COMPREHENSIVE
A-G Ratio: 0.8 — ABNORMAL LOW (ref 1.2–3.5)
ALT (SGPT): 24 U/L (ref 12–65)
AST (SGOT): 29 U/L (ref 15–37)
Albumin: 3.2 g/dL (ref 3.2–4.6)
Alk. phosphatase: 81 U/L (ref 50–136)
Anion gap: 13 mmol/L (ref 7–16)
BUN: 11 MG/DL (ref 8–23)
Bilirubin, total: 2.3 MG/DL — ABNORMAL HIGH (ref 0.2–1.1)
CO2: 26 MMOL/L (ref 21–32)
Calcium: 8.1 MG/DL — ABNORMAL LOW (ref 8.3–10.4)
Chloride: 105 MMOL/L (ref 98–107)
Creatinine: 1.24 MG/DL (ref 0.8–1.5)
GFR est AA: >60 mL/min/{1.73_m2} (ref >60)
GFR est non-AA: 59 mL/min/{1.73_m2} — ABNORMAL LOW (ref >60)
Globulin: 3.9 g/dL — ABNORMAL HIGH (ref 2.3–3.5)
Glucose: 203 MG/DL — ABNORMAL HIGH (ref 65–100)
Potassium: 2.9 MMOL/L — CL (ref 3.5–5.1)
Protein, total: 7.1 g/dL (ref 6.3–8.2)
Sodium: 144 MMOL/L (ref 136–145)

## 2011-07-23 LAB — POC TROPONIN
Troponin-I (POC): 0.12 ng/ml — ABNORMAL HIGH (ref 0.0–0.08)
Troponin-I (POC): 0.17 ng/ml — ABNORMAL HIGH (ref 0.0–0.08)

## 2011-07-23 LAB — BNP: BNP: 511 pg/mL

## 2011-07-23 LAB — TROPONIN I: Troponin-I, Qt.: 0.25 NG/ML — ABNORMAL HIGH (ref 0.02–0.05)

## 2011-07-23 MED ORDER — POTASSIUM CHLORIDE 10 % ORAL LIQUID
20 mEq/15 mL | ORAL | Status: AC
Start: 2011-07-23 — End: 2011-07-23
  Administered 2011-07-23: 14:00:00 via ORAL

## 2011-07-23 MED ORDER — METOPROLOL TARTRATE 5 MG/5 ML IV SOLN
5 mg/ mL | INTRAVENOUS | Status: AC
Start: 2011-07-23 — End: 2011-07-23
  Administered 2011-07-23 (×3): via INTRAVENOUS

## 2011-07-23 MED ORDER — BUMETANIDE 0.25 MG/ML IJ SOLN
0.25 mg/mL | Freq: Two times a day (BID) | INTRAMUSCULAR | Status: DC
Start: 2011-07-23 — End: 2011-07-26
  Administered 2011-07-23 – 2011-07-26 (×8): via INTRAVENOUS

## 2011-07-23 MED ADMIN — amLODIPine (NORVASC) tablet 10 mg: ORAL | @ 18:00:00 | NDC 59762154005

## 2011-07-23 MED ADMIN — lisinopril (PRINIVIL, ZESTRIL) tablet 30 mg: ORAL | @ 18:00:00 | NDC 68084006211

## 2011-07-23 MED ADMIN — acetaminophen-codeine (TYLENOL #3) per tablet 1 Tab: ORAL | @ 22:00:00 | NDC 00406048462

## 2011-07-23 MED ADMIN — diphenoxylate-atropine (LOMOTIL) tablet 2 Tab: ORAL | @ 17:00:00 | NDC 51079006701

## 2011-07-23 MED ADMIN — clopidogrel (PLAVIX) tablet 75 mg: ORAL | @ 18:00:00 | NDC 68084060911

## 2011-07-23 MED ADMIN — aspirin chewable tablet 81 mg: ORAL | @ 18:00:00 | NDC 63739043401

## 2011-07-23 MED ADMIN — heparin 25,000 units in dextrose 500 mL infusion: INTRAVENOUS | @ 18:00:00 | NDC 00264957710

## 2011-07-23 MED ADMIN — bumetanide (BUMEX) injection 1 mg: INTRAVENOUS | @ 14:00:00 | NDC 00409141204

## 2011-07-23 MED ADMIN — carvedilol (COREG) tablet 3.125 mg: ORAL | @ 22:00:00 | NDC 68084026111

## 2011-07-23 MED ADMIN — diphenoxylate-atropine (LOMOTIL) tablet 2 Tab: ORAL | @ 22:00:00 | NDC 51079006701

## 2011-07-23 MED ADMIN — heparin (porcine) injection 5,000 Units: INTRAVENOUS | @ 17:00:00 | NDC 63323026201

## 2011-07-23 MED FILL — TIMOLOL MALEATE 0.25 % EYE DROPS: 0.25 % | OPHTHALMIC | Qty: 5

## 2011-07-23 MED FILL — ASPIRIN 81 MG CHEWABLE TAB: 81 mg | ORAL | Qty: 1

## 2011-07-23 MED FILL — BUMETANIDE 0.25 MG/ML IJ SOLN: 0.25 mg/mL | INTRAMUSCULAR | Qty: 4

## 2011-07-23 MED FILL — HEPARIN (PORCINE) 5,000 UNIT/ML IJ SOLN: 5000 unit/mL | INTRAMUSCULAR | Qty: 1

## 2011-07-23 MED FILL — CLOPIDOGREL 75 MG TAB: 75 mg | ORAL | Qty: 1

## 2011-07-23 MED FILL — LATANOPROST 0.005 % EYE DROPS: 0.005 % | OPHTHALMIC | Qty: 2.5

## 2011-07-23 MED FILL — CARVEDILOL 3.125 MG TAB: 3.125 mg | ORAL | Qty: 1

## 2011-07-23 MED FILL — DIPHENOXYLATE-ATROPINE 2.5 MG-0.025 MG TAB: ORAL | Qty: 2

## 2011-07-23 MED FILL — METOPROLOL TARTRATE 5 MG/5 ML IV SOLN: 5 mg/ mL | INTRAVENOUS | Qty: 5

## 2011-07-23 MED FILL — AMLODIPINE 10 MG TAB: 10 mg | ORAL | Qty: 1

## 2011-07-23 MED FILL — ACETAMINOPHEN-CODEINE 300 MG-30 MG TAB: 300-30 mg | ORAL | Qty: 1

## 2011-07-23 MED FILL — HEPARIN (PORCINE) IN D5W 25,000 UNIT/500 ML IV: 25000 unit/500 mL (50 unit/mL) | INTRAVENOUS | Qty: 500

## 2011-07-23 MED FILL — LISINOPRIL 20 MG TAB: 20 mg | ORAL | Qty: 1

## 2011-07-23 MED FILL — POTASSIUM CHLORIDE 10 % ORAL LIQUID: 20 mEq/15 mL | ORAL | Qty: 30

## 2011-07-23 NOTE — ED Notes (Signed)
Onset Sunday/ went to bed/ felt ok yesterday/ Albuterol 5mg  in route/ nitro paste in place PTA/ ASA 324mg  PTA/ + rales/ O2 92% on Cpap    No history of afib, in afib in route/ LBBB

## 2011-07-23 NOTE — Progress Notes (Signed)
Bedside and Verbal shift change report received from Frank, RN

## 2011-07-23 NOTE — H&P (Addendum)
[facility] History &Physical                 Primary Cardiologist: Dr. Theressa Millard    Primary Care Physician: [facility]       Subjective:     Patient is a 78 y.o. male who presents with sudden onset of severe shortness of breath.  Patient stated he was doing light activity this morning and suddenly became short of breath.  He reported associated chest tightness.  He took 1 SL NTG without much relief.  EMS was summoned and he was placed on CPAP.  NTG paste was also applied.  He denied any further chest pain after NTG paste.  He reports increased DOE recently and pedal edema.  He was transitioned to Bipap upon arrival to [facility] ER.  He was eventually changed to O2 by NC.  He denies any current chest discomfort and states his breathing is much improved.  Initial troponin was elevated at 0.12.  BNP was elevated at 511.  EKG showed atrial fibrillation with LBBB.  LBBB is chronic but he has not had any documented atrial fibrillation in the past.    Past history includes CAD with history of CABG, chronic systolic congestive heart failure, HTN, dyslipidemia.    Past Medical History   Diagnosis Date   ??? Actinic keratitis    ??? Opioid dependence    ??? Senile macular degeneration    ??? Glaucoma    ??? Male erectile disorder    ??? Anemia    ??? Vitamin B 12 deficiency    ??? Crohn disease    ??? Osteoarthritis    ??? Coronary artery disease       Past Surgical History   Procedure Date   ??? Hx cholecystectomy    ??? Hx small bowel resection    ??? Pr left heart cath,percutaneous 12/10/2010     Xience to LAD and left main/ CABG      @[facility] @  No Known Allergies  History   Substance Use Topics   ??? Smoking status: Former Smoker   ??? Smokeless tobacco: Not on file   ??? Alcohol Use: No      FH: non-contributory    Review of Systems  General: no recent weight loss/gain, weakness, fatigue, fever or chills  Skin: no rashes, lumps, or other skin changes  HEENT: no headache, dizziness, lightheadedness, vision changes, hearing changes, tinnitus, vertigo, sinus  pressure/pain, bleeding gums, sore throat, or hoarseness  Neck: no swollen glands, goiter, pain or stiffness  Respiratory: +dyspnea, no cough, sputum, hemoptysis, wheezing  Cardiovascular: +chest pain, dyspnea, peripheral edema, no palpitations, orthopnea, paroxysmal nocturnal dyspnea  Gastrointestinal: +chronic diarrhea, GI virus type illness 1 1/2 weeks ago with vomiting and diarrhea, no trouble swallowing, heartburn, change of appetite, nausea, change in bowel habits, pain with defecation, rectal bleeding or black/tarry stools, hemorrhoids, constipation, abdominal pain  Urinary: no frequency, urgency , hematuria, burning/pain with urination, recent flank pain, polyuria, nocturia, or difficulty urinating  Peripheral Vascular: no claudication, leg cramps, prior DVTs, swelling of calves, legs, or feet, color change, or swelling with redness or tenderness  Musculoskeletal: no muscle or joint pain/stiffness, joint swelling, erythema of joints, or back pain  Psychiatric: no depression, mental disorders, or excessive stress  Neurological: no sensory or motor loss, seizures, syncope, tremors, numbness, tingling, no changes in mood, attention, or speech, no changes in orientation, memory, insight, or judgment.  no headache, dizziness, vertigo.    Hematologic: no anemia, easy bruising or bleeding  Endocrine:  no thyroid problems, heat or cold intolerance, excessive sweating, polyuria, polydipsia, or history of diabetes.      Objective:       BP 170/79   Pulse 93   Resp 18   Ht 5\' 8"  (1.727 m)   Wt 63.504 kg (140 lb)   BMI 21.29 kg/m2   SpO2 97%     Physical Exam:  General: Well Developed, Well Nourished, No Acute Distress  HEENT: pupils equal and round, no abnormalities noted  Neck: supple, no JVD  Heart: S1S2 with IRR  Lungs: Decreased bilaterally with crackles   Abd: soft, nontender, nondistended, with good bowel sounds  Ext: warm, no edema, calves supple/nontender, pulses 2+ bilaterally  Skin: warm and dry  Psychiatric:  Normal mood and affect  Neurologic: Alert and oriented X 3      ECG: atrial fibrillation with LBBB    Data Review:     Recent Results (from the past 24 hour(s))   BNP    Collection Time    07/23/11  8:00 AM       Component Value Range    BNP 511     CBC WITH AUTOMATED DIFF    Collection Time    07/23/11  8:50 AM       Component Value Range    WBC 8.4  4.3 - 11.1 K/uL    RBC 4.73  4.23 - 5.67 M/uL    HGB 9.7 (*) 13.2 - 17.1 g/dL    HCT 16.1 (*) 09.6 - 50.3 %    MCV 64.7 (*) 79.6 - 97.8 FL    MCH 20.5 (*) 26.1 - 32.9 PG    MCHC 31.7  31.4 - 35.0 g/dL    RDW 04.5 (*) 40.9 - 14.6 %    PLATELET 184  150 - 450 K/uL    MPV 10.5 (*) 10.8 - 14.1 FL    NEUTROPHILS 84 (*) 43 - 78 %    LYMPHOCYTES 8 (*) 13 - 44 %    MONOCYTES 8  4.0 - 12.0 %    EOSINOPHILS 0 (*) 0.5 - 7.8 %    BASOPHILS 0  0.0 - 2.0 %    IMMATURE GRANULOCYTES 0  0.0 - 5.0 %    ABS. NEUTROPHILS 7.0  1.7 - 8.2 K/UL    ABS. LYMPHOCYTES 0.7  0.5 - 4.6 K/UL    ABS. MONOCYTES 0.7  0.1 - 1.3 K/UL    ABS. EOSINOPHILS 0.0  0.0 - 0.8 K/UL    ABS. BASOPHILS 0.0  0.0 - 0.2 K/UL    ABS. IMM. GRANS. 0.0  0.0 - 0.5 K/UL    RBC COMMENTS        Value: SLIGHT ANISOCYTOSIS + POIKILOCYTOSIS      SLIGHT MICROCYTOSIS      OCCASIONAL OVALOCYTES    WBC COMMENTS Result Confirmed By Smear      PLATELET COMMENTS ADEQUATE      DF AUTOMATED     METABOLIC PANEL, COMPREHENSIVE    Collection Time    07/23/11  8:50 AM       Component Value Range    Sodium 144  136 - 145 MMOL/L    Potassium 2.9 (*) 3.5 - 5.1 MMOL/L    Chloride 105  98 - 107 MMOL/L    CO2 26  21 - 32 MMOL/L    Anion gap 13  7 - 16 mmol/L    Glucose 203 (*) 65 - 100 MG/DL    BUN 11  8 - 23 MG/DL    Creatinine 1.61  0.8 - 1.5 MG/DL    GFR est AA >09  >60 ml/min/1.85m2    GFR est non-AA 59 (*) >60 ml/min/1.16m2    Calcium 8.1 (*) 8.3 - 10.4 MG/DL    Bilirubin, total 2.3 (*) 0.2 - 1.1 MG/DL    ALT 24  12 - 65 U/L    AST 29  15 - 37 U/L    Alk. phosphatase 81  50 - 136 U/L    Protein, total 7.1  6.3 - 8.2 g/dL    Albumin 3.2  3.2 - 4.6  g/dL    Globulin 3.9 (*) 2.3 - 3.5 g/dL    A-G Ratio 0.8 (*) 1.2 - 3.5     POC TROPONIN-I    Collection Time    07/23/11  8:57 AM       Component Value Range    Troponin-I (POC) 0.12 (*) 0.0 - 0.08 ng/ml   BLOOD GAS, ARTERIAL    Collection Time    07/23/11  9:28 AM       Component Value Range    pH 7.48 (*) 7.35 - 7.45      PCO2 38  35.0 - 45.0 mmHg    PO2 278 (*) 75.0 - 100.0 mmHg    BICARBONATE 28 (*) 22.0 - 26.0 mmol/L    BASE EXCESS 3.9 (*) 0 - 3 mmol/L    TOTAL HEMOGLOBIN 9.6 (*) 11.7 - 15.0 GM/DL    O2 SAT 99 (*) 45.4 - 98.5 %    ARTERIAL O2 HGB 98.2 (*) 94.0 - 97.0 %    CARBOXYHEMOGLOBIN 0.3 (*) 0.5 - 1.5 %    METHEMOGLOBIN 0.6  0.0 - 1.5 %    DEOXYHEMOGLOBIN 1  0.0 - 5.0 %    SITE RB      ALLENS TEST POSITIVE      MODE BIPAP12 6      FIO2 100.0         CXR: Consistent with CHF    Assessment/Plan:   Principal Problem:   *Acute respiratory failure (07/23/2011)-was on CPAP then bipap, currently tolerated NC at 6 liters, will diurese and monitor.  Will need LHC once stabilizes to exclude ischemic etiology    Active Problems:     Acute on chronic systolic congestive heart failure (07/23/2011)-given Bumex in the ER, will continue diuresis, will start low dose BB(pt had bradycardia in the past with 4 sec pause and 2:1 AV block in setting of NSTEMI, will monitor closely for any bradycaria), continue ACE-I, check echo     Elevated troponin (07/23/2011)-possibly supply/demand mismatch in setting of acute systolic and diastolic CHF but known CAD, will start Heparin and trend enzymes, if troponin continues to rise, will proceed with LHC urgently.     Hypokalemia (07/23/2011)-repleted in the ER, will monitor and replace as needed, pt has Rx for potassium supplement but states he never opened it     Accelerated hypertension (07/23/2011)-BP improved with NTG paste and IV lopressor, will start BB as above and monitor     CAD (coronary artery disease) (07/23/2011)-history of CABG, last cath in 11/2010 with PCI to left main and proximal to  mid LAD, pt has been compliant with aspirin and plavix, on ACE-I and statin, not on BB due to bradycardia in the past that was possibly ischemic driven, will start low dose BB and closely monitor     Atrial fibrillation (07/23/2011)-? New, was not in a-fib in Aug/Sept  2012, no hx of palpitations, was given IV Lopressor in the ER for accelerated HTN and patient converted to sinus, starting Heparin as above, will attempt to start BB if no bradycardia noted     Dyslipidemia (07/23/2011)-continue statin, will check fasting lipid panel in AM          Ebony Hail, MD

## 2011-07-23 NOTE — Progress Notes (Signed)
Pt taken off bipap per Dr. Marny Lowenstein

## 2011-07-23 NOTE — ED Notes (Signed)
Awaiting cardiology to re-eval

## 2011-07-23 NOTE — Progress Notes (Signed)
Dr. Brantley Fling made aware of pt. Troponin 0.25.  No pain or s/s of distress noted at this time.  No new orders at this time.  Will continue to monitor.

## 2011-07-23 NOTE — Progress Notes (Signed)
Report given to incoming nurse Minda Ditto, RN.

## 2011-07-23 NOTE — Progress Notes (Signed)
Admission assessment completed. Alert and oriented X 3, respirations regular but labored, cardiac rhythm irregular (A-Fib) peripheral pulses present, 2 + pedal edema noted, no CP stated. POC explained. Dual nurse skin assessment performed with Arline Asp, RN. Mid line chest scar from previous CABG incision. Rest of the skin intact. Aware of call for assistance.

## 2011-07-23 NOTE — ED Notes (Signed)
TRANSFER - OUT REPORT:    Verbal report given to Homero Fellers  (name) on Jeret Goyer  being transferred to 333(unit) for routine progression of care       Report consisted of patient???s Situation, Background, Assessment and   Recommendations(SBAR).     Information from the following report(s) SBAR, ED Summary and MAR was reviewed with the receiving nurse.    Opportunity for questions and clarification was provided.

## 2011-07-23 NOTE — Progress Notes (Signed)
TRANSFER - IN REPORT:    Verbal report received from Lanice Schwab, RN   on Gilbert Nunez  being received from ER for routine progression of care      Report consisted of patient???s Situation, Background, Assessment and   Recommendations(SBAR).     Information from the following report(s) SBAR, MAR and Recent Results was reviewed with the receiving nurse.    Opportunity for questions and clarification was provided.      Assessment completed upon patient???s arrival to unit and care assumed.

## 2011-07-23 NOTE — ED Notes (Signed)
BGL 244

## 2011-07-23 NOTE — ED Provider Notes (Addendum)
HPI Comments: Gilbert Nunez is a 78 y.o. Caucasian male in mod resp distress presents with c/o SOB onset 2 days ago. Pt placed on C Pap by EMS and changed to Bi Pap in the ED. Pt denies CP but states he did have some Chest tightness. Pt improved on BiPap and is pain free at present.      Patient is a 78 y.o. male presenting with shortness of breath. The history is provided by the patient.   Shortness of Breath  This is a new problem. The problem occurs rarely.The current episode started 2 days ago. The problem has been gradually worsening. Pertinent negatives include no fever, no headaches, no coryza, no rhinorrhea, no sore throat, no swollen glands, no ear pain, no neck pain, no cough, no sputum production, no hemoptysis, no wheezing, no PND, no orthopnea, no chest pain, no syncope, no vomiting, no abdominal pain, no rash, no leg pain, no leg swelling and no claudication. It is unknown what precipitated the problem. He has tried nothing for the symptoms. He has had prior hospitalizations. Associated medical issues include CAD.        Past Medical History   Diagnosis Date   ??? Actinic keratitis    ??? Opioid dependence    ??? Senile macular degeneration    ??? Glaucoma    ??? Male erectile disorder    ??? Anemia    ??? Vitamin B 12 deficiency    ??? Crohn disease    ??? Osteoarthritis    ??? Coronary artery disease         Past Surgical History   Procedure Date   ??? Hx cholecystectomy    ??? Hx small bowel resection    ??? Pr left heart cath,percutaneous 12/10/2010     Xience to LAD and left main/ CABG         No family history on file.     History     Social History   ??? Marital Status: WIDOWED     Spouse Name: N/A     Number of Children: N/A   ??? Years of Education: N/A     Occupational History   ??? Not on file.     Social History Main Topics   ??? Smoking status: Former Smoker   ??? Smokeless tobacco: Not on file   ??? Alcohol Use: No   ??? Drug Use: No   ??? Sexually Active:      Other Topics Concern   ??? Not on file     Social History Narrative   ???  No narrative on file                  ALLERGIES: Review of patient's allergies indicates no known allergies.      Review of Systems   Constitutional: Negative for fever.   HENT: Negative for ear pain, sore throat, rhinorrhea and neck pain.    Respiratory: Positive for shortness of breath. Negative for cough, hemoptysis, sputum production and wheezing.    Cardiovascular: Negative for chest pain, orthopnea, claudication, leg swelling, syncope and PND.   Gastrointestinal: Negative for vomiting and abdominal pain.   Skin: Negative for rash.   Neurological: Negative for headaches.   All other systems reviewed and are negative.        Filed Vitals:    07/23/11 0844 07/23/11 0845   Pulse: 98    Resp: 26    Height:  5\' 8"  (1.727 m)   Weight:  63.504 kg (  140 lb)   SpO2: 86%             Physical Exam   Nursing note and vitals reviewed.  Constitutional: He is oriented to person, place, and time. He appears cachectic. He is cooperative.  Non-toxic appearance. He has a sickly appearance. He does not appear ill. No distress.   HENT:   Head: Normocephalic and atraumatic.   Right Ear: External ear normal.   Left Ear: External ear normal.   Nose: Nose normal.   Mouth/Throat: Oropharynx is clear and moist.   Eyes: Conjunctivae and EOM are normal. No scleral icterus.   Neck: Normal range of motion and full passive range of motion without pain. Neck supple.   Cardiovascular: Normal heart sounds, intact distal pulses and normal pulses.  An irregularly irregular rhythm present. Tachycardia present.  Exam reveals no gallop.    No murmur heard.  Pulmonary/Chest: Effort normal and breath sounds normal. No respiratory distress.   Abdominal: Soft. Normal appearance. There is no tenderness.   Musculoskeletal: Normal range of motion. He exhibits no tenderness.   Neurological: He is alert and oriented to person, place, and time. He has normal strength. No sensory deficit.   Skin: Skin is warm, dry and intact. No rash noted. He is not  diaphoretic. There is pallor.   Psychiatric: He has a normal mood and affect. His speech is normal and behavior is normal. Judgment and thought content normal. Cognition and memory are normal.        MDM    Procedures    Recent Results (from the past 24 hour(s))   CBC WITH AUTOMATED DIFF    Collection Time    07/23/11  8:50 AM       Component Value Range    WBC 8.4  4.3 - 11.1 K/uL    RBC 4.73  4.23 - 5.67 M/uL    HGB 9.7 (*) 13.2 - 17.1 Amylia Collazos/dL    HCT 16.1 (*) 09.6 - 50.3 %    MCV 64.7 (*) 79.6 - 97.8 FL    MCH 20.5 (*) 26.1 - 32.9 PG    MCHC 31.7  31.4 - 35.0 Melville Engen/dL    RDW 04.5 (*) 40.9 - 14.6 %    PLATELET 184  150 - 450 K/uL    MPV 10.5 (*) 10.8 - 14.1 FL    DF PENDING     METABOLIC PANEL, COMPREHENSIVE    Collection Time    07/23/11  8:50 AM       Component Value Range    Sodium 144  136 - 145 MMOL/L    Potassium 2.9 (*) 3.5 - 5.1 MMOL/L    Chloride 105  98 - 107 MMOL/L    CO2 26  21 - 32 MMOL/L    Anion gap 13  7 - 16 mmol/L    Glucose 203 (*) 65 - 100 MG/DL    BUN 11  8 - 23 MG/DL    Creatinine 8.11  0.8 - 1.5 MG/DL    GFR est AA >91  >47 ml/min/1.57m2    GFR est non-AA 59 (*) >60 ml/min/1.42m2    Calcium 8.1 (*) 8.3 - 10.4 MG/DL    Bilirubin, total 2.3 (*) 0.2 - 1.1 MG/DL    ALT 24  12 - 65 U/L    AST 29  15 - 37 U/L    Alk. phosphatase 81  50 - 136 U/L    Protein, total 7.1  6.3 - 8.2 Bre Pecina/dL  Albumin 3.2  3.2 - 4.6 Jaeli Grubb/dL    Globulin 3.9 (*) 2.3 - 3.5 Pessy Delamar/dL    A-Prestyn Stanco Ratio 0.8 (*) 1.2 - 3.5     POC TROPONIN-I    Collection Time    07/23/11  8:57 AM       Component Value Range    Troponin-I (POC) 0.12 (*) 0.0 - 0.08 ng/ml   BLOOD GAS, ARTERIAL    Collection Time    07/23/11  9:28 AM       Component Value Range    pH 7.48 (*) 7.35 - 7.45      PCO2 38  35.0 - 45.0 mmHg    PO2 278 (*) 75.0 - 100.0 mmHg    BICARBONATE 28 (*) 22.0 - 26.0 mmol/L    BASE EXCESS 3.9 (*) 0 - 3 mmol/L    TOTAL HEMOGLOBIN 9.6 (*) 11.7 - 15.0 GM/DL    O2 SAT 99 (*) 16.1 - 98.5 %    ARTERIAL O2 HGB 98.2 (*) 94.0 - 97.0 %    CARBOXYHEMOGLOBIN 0.3 (*) 0.5 -  1.5 %    METHEMOGLOBIN 0.6  0.0 - 1.5 %    DEOXYHEMOGLOBIN 1  0.0 - 5.0 %    SITE RB      ALLENS TEST POSITIVE      MODE BIPAP12 6      FIO2 100.0        EKG shows A Fib and L BBB which is new  CXR shows Pul Edema    Assessment: Non stemi MI, CHF, Hypokalemia    Plan: Cardiology Consult for admission

## 2011-07-23 NOTE — Progress Notes (Signed)
Patient on sinus rhythm with a 1st degree at this time.

## 2011-07-24 LAB — CBC W/O DIFF
HCT: 28.2 % — ABNORMAL LOW (ref 41.1–50.3)
HGB: 8.8 g/dL — ABNORMAL LOW (ref 13.2–17.1)
MCH: 20.1 PG — ABNORMAL LOW (ref 26.1–32.9)
MCHC: 31.2 g/dL — ABNORMAL LOW (ref 31.4–35.0)
MCV: 64.5 FL — ABNORMAL LOW (ref 79.6–97.8)
MPV: 10.6 FL — ABNORMAL LOW (ref 10.8–14.1)
PLATELET: 175 10*3/uL (ref 150–450)
RBC: 4.37 M/uL (ref 4.23–5.67)
RDW: 14.9 % — ABNORMAL HIGH (ref 11.9–14.6)
WBC: 4.6 10*3/uL (ref 4.3–11.1)

## 2011-07-24 LAB — LIPID PANEL
CHOL/HDL Ratio: 2.8
Cholesterol, total: 102 MG/DL (ref <200)
HDL Cholesterol: 37 MG/DL — ABNORMAL LOW (ref 40–60)
LDL, calculated: 44.2 MG/DL (ref <100)
Triglyceride: 104 MG/DL (ref 35–150)
VLDL, calculated: 20.8 MG/DL (ref 6.0–23.0)

## 2011-07-24 LAB — METABOLIC PANEL, BASIC
Anion gap: 9 mmol/L (ref 7–16)
BUN: 10 MG/DL (ref 8–23)
CO2: 34 MMOL/L — ABNORMAL HIGH (ref 21–32)
Calcium: 7.7 MG/DL — ABNORMAL LOW (ref 8.3–10.4)
Chloride: 99 MMOL/L (ref 98–107)
Creatinine: 1.12 MG/DL (ref 0.8–1.5)
GFR est AA: >60 mL/min/{1.73_m2} (ref >60)
GFR est non-AA: >60 mL/min/{1.73_m2} (ref >60)
Glucose: 151 MG/DL — ABNORMAL HIGH (ref 65–100)
Potassium: 2.7 MMOL/L — CL (ref 3.5–5.1)
Sodium: 142 MMOL/L (ref 136–145)

## 2011-07-24 LAB — TROPONIN I
Troponin-I, Qt.: 0.22 NG/ML — ABNORMAL HIGH (ref 0.02–0.05)
Troponin-I, Qt.: 0.19 NG/ML — ABNORMAL HIGH (ref 0.02–0.05)

## 2011-07-24 LAB — POTASSIUM: Potassium: 4.2 MMOL/L (ref 3.5–5.1)

## 2011-07-24 LAB — PTT
aPTT: 33.1 s — ABNORMAL HIGH (ref 23.5–31.7)
aPTT: 36 s — ABNORMAL HIGH (ref 23.5–31.7)

## 2011-07-24 LAB — MAGNESIUM: Magnesium: 1.2 MG/DL — CL (ref 1.8–2.4)

## 2011-07-24 MED ORDER — HEPARIN (PORCINE) IN NS (PF) 2,000 UNIT/1,000 ML IV
2000 unit/1,000 mL | INTRAVENOUS | Status: DC
Start: 2011-07-24 — End: 2011-07-25
  Administered 2011-07-24: 18:00:00 via INTRA_ARTERIAL

## 2011-07-24 MED ORDER — IOVERSOL 350 MG/ML IV SOLN
350 mg iodine/mL | Freq: Once | INTRAVENOUS | Status: AC
Start: 2011-07-24 — End: 2011-07-24
  Administered 2011-07-24: 19:00:00 via INTRA_ARTERIAL

## 2011-07-24 MED ORDER — MIDAZOLAM 1 MG/ML IJ SOLN
1 mg/mL | Freq: Once | INTRAMUSCULAR | Status: DC | PRN
Start: 2011-07-24 — End: 2011-07-25
  Administered 2011-07-24 (×2): via INTRAVENOUS

## 2011-07-24 MED ORDER — HEPARIN (PORCINE) 10,000 UNIT/ML IJ SOLN
10000 unit/mL | Freq: Once | INTRAMUSCULAR | Status: AC
Start: 2011-07-24 — End: 2011-07-24
  Administered 2011-07-24: 18:00:00 via INTRAVENOUS

## 2011-07-24 MED ORDER — ADENOSINE (DIAGNOSTIC) 3 MG/ML IV SOLN
3 mg/mL | INTRAVENOUS | Status: DC
Start: 2011-07-24 — End: 2011-07-25
  Administered 2011-07-24: 19:00:00 via INTRAVENOUS

## 2011-07-24 MED ADMIN — heparin 25,000 units in dextrose 500 mL infusion: INTRAVENOUS | @ 03:00:00 | NDC 00264957710

## 2011-07-24 MED ADMIN — 0.9% sodium chloride infusion: INTRAVENOUS | @ 22:00:00 | NDC 00409798309

## 2011-07-24 MED ADMIN — hydrALAZINE (APRESOLINE) tablet 25 mg: ORAL | @ 14:00:00 | NDC 62584073311

## 2011-07-24 MED ADMIN — potassium chloride 20 mEq in 100 ml IVPB: INTRAVENOUS | @ 14:00:00 | NDC 00048002116

## 2011-07-24 MED ADMIN — fentaNYL citrate (PF) injection 25-75 mcg: INTRAVENOUS | @ 18:00:00 | NDC 00409909422

## 2011-07-24 MED ADMIN — timolol (TIMOPTIC) 0.25% ophthalmic solution: OPHTHALMIC | @ 20:00:00 | NDC 61314022605

## 2011-07-24 MED ADMIN — hydrALAZINE (APRESOLINE) tablet 25 mg: ORAL | @ 22:00:00 | NDC 62584073311

## 2011-07-24 MED ADMIN — potassium chloride 20 mEq in 100 ml IVPB: INTRAVENOUS | @ 13:00:00 | NDC 00338070548

## 2011-07-24 MED ADMIN — heparin 25,000 units in dextrose 500 mL infusion: INTRAVENOUS | @ 11:00:00 | NDC 00264957710

## 2011-07-24 MED ADMIN — aspirin chewable tablet 81 mg: ORAL | @ 13:00:00 | NDC 63739043401

## 2011-07-24 MED ADMIN — potassium chloride (K-DUR, KLOR-CON) SR tablet 40 mEq: ORAL | @ 14:00:00 | NDC 68084036011

## 2011-07-24 MED ADMIN — atropine 0.1 mg/mL injection: @ 21:00:00 | NDC 76329334001

## 2011-07-24 MED ADMIN — amLODIPine (NORVASC) tablet 10 mg: ORAL | @ 13:00:00 | NDC 59762154005

## 2011-07-24 MED ADMIN — heparin (porcine) injection 2,250 Units: INTRAVENOUS | @ 03:00:00 | NDC 63323026201

## 2011-07-24 MED ADMIN — timolol (TIMOPTIC) 0.25% ophthalmic solution: OPHTHALMIC | @ 13:00:00 | NDC 61314022605

## 2011-07-24 MED ADMIN — 0.9% sodium chloride infusion: INTRAVENOUS | @ 13:00:00 | NDC 00409798309

## 2011-07-24 MED ADMIN — lisinopril (PRINIVIL, ZESTRIL) tablet 30 mg: ORAL | @ 13:00:00 | NDC 68084006211

## 2011-07-24 MED ADMIN — pravastatin (PRAVACHOL) tablet 40 mg: ORAL | @ 01:00:00 | NDC 68084050111

## 2011-07-24 MED ADMIN — acetaminophen-codeine (TYLENOL #3) per tablet 1 Tab: ORAL | @ 15:00:00 | NDC 00406048462

## 2011-07-24 MED ADMIN — magnesium sulfate 3 g in 0.9% sodium chloride IVPB: INTRAVENOUS | NDC 63323006402

## 2011-07-24 MED ADMIN — diphenoxylate-atropine (LOMOTIL) tablet 2 Tab: ORAL | @ 01:00:00 | NDC 51079006701

## 2011-07-24 MED ADMIN — heparin (porcine) injection 2,250 Units: INTRAVENOUS | @ 11:00:00 | NDC 63323026201

## 2011-07-24 MED ADMIN — acetaminophen-codeine (TYLENOL #3) per tablet 1 Tab: ORAL | @ 20:00:00 | NDC 00406048462

## 2011-07-24 MED ADMIN — potassium chloride (K-DUR, KLOR-CON) SR tablet 40 mEq: ORAL | @ 22:00:00 | NDC 68084036011

## 2011-07-24 MED ADMIN — amiodarone (CORDARONE) tablet 200 mg: ORAL | @ 14:00:00 | NDC 68084037111

## 2011-07-24 MED ADMIN — diphenoxylate-atropine (LOMOTIL) tablet 2 Tab: ORAL | @ 13:00:00 | NDC 51079006701

## 2011-07-24 MED ADMIN — carvedilol (COREG) tablet 3.125 mg: ORAL | @ 13:00:00 | NDC 00904630061

## 2011-07-24 MED ADMIN — latanoprost (XALATAN) 0.005 % ophthalmic solution 1 Drop: OPHTHALMIC | @ 03:00:00 | NDC 61314054701

## 2011-07-24 MED ADMIN — clopidogrel (PLAVIX) tablet 75 mg: ORAL | @ 13:00:00 | NDC 68084060911

## 2011-07-24 MED ADMIN — diphenoxylate-atropine (LOMOTIL) tablet 2 Tab: ORAL | @ 16:00:00 | NDC 51079006701

## 2011-07-24 MED ADMIN — diphenoxylate-atropine (LOMOTIL) tablet 2 Tab: ORAL | @ 22:00:00 | NDC 51079006701

## 2011-07-24 MED ADMIN — temazepam (RESTORIL) capsule 30 mg: ORAL | @ 03:00:00 | NDC 51079041901

## 2011-07-24 MED FILL — OPTIRAY 350 MG IODINE/ML INTRAVENOUS SOLUTION: 350 mg iodine/mL | INTRAVENOUS | Qty: 300

## 2011-07-24 MED FILL — ACETAMINOPHEN-CODEINE 300 MG-30 MG TAB: 300-30 mg | ORAL | Qty: 1

## 2011-07-24 MED FILL — AMIODARONE 200 MG TAB: 200 mg | ORAL | Qty: 1

## 2011-07-24 MED FILL — MAGNESIUM SULFATE 50 % (4 MEQ/ML) INJECTION: 4 mEq/mL (50 %) | INTRAMUSCULAR | Qty: 6

## 2011-07-24 MED FILL — KLOR-CON M20 MEQ TABLET,EXTENDED RELEASE: 20 mEq | ORAL | Qty: 2

## 2011-07-24 MED FILL — ASPIRIN 81 MG CHEWABLE TAB: 81 mg | ORAL | Qty: 1

## 2011-07-24 MED FILL — LISINOPRIL 5 MG TAB: 5 mg | ORAL | Qty: 2

## 2011-07-24 MED FILL — TEMAZEPAM 30 MG CAP: 30 mg | ORAL | Qty: 1

## 2011-07-24 MED FILL — BUMETANIDE 0.25 MG/ML IJ SOLN: 0.25 mg/mL | INTRAMUSCULAR | Qty: 4

## 2011-07-24 MED FILL — DIPHENOXYLATE-ATROPINE 2.5 MG-0.025 MG TAB: ORAL | Qty: 2

## 2011-07-24 MED FILL — HEPARIN (PORCINE) 10,000 UNIT/ML IJ SOLN: 10000 unit/mL | INTRAMUSCULAR | Qty: 1

## 2011-07-24 MED FILL — HEPARIN (PORCINE) 5,000 UNIT/ML IJ SOLN: 5000 unit/mL | INTRAMUSCULAR | Qty: 1

## 2011-07-24 MED FILL — FENTANYL CITRATE (PF) 50 MCG/ML IJ SOLN: 50 mcg/mL | INTRAMUSCULAR | Qty: 2

## 2011-07-24 MED FILL — SODIUM CHLORIDE 0.9 % IV: INTRAVENOUS | Qty: 1000

## 2011-07-24 MED FILL — PRAVASTATIN 20 MG TAB: 20 mg | ORAL | Qty: 2

## 2011-07-24 MED FILL — CARVEDILOL 3.125 MG TAB: 3.125 mg | ORAL | Qty: 1

## 2011-07-24 MED FILL — POTASSIUM CHLORIDE 20 MEQ/100 ML IV PIGGY BACK: 20 mEq/100 mL | INTRAVENOUS | Qty: 100

## 2011-07-24 MED FILL — MIDAZOLAM 1 MG/ML IJ SOLN: 1 mg/mL | INTRAMUSCULAR | Qty: 2

## 2011-07-24 MED FILL — HEPARIN (PORCINE) IN NS (PF) 2,000 UNIT/1,000 ML IV: 2000 unit/1,000 mL | INTRAVENOUS | Qty: 1000

## 2011-07-24 MED FILL — ATROPINE 0.1 MG/ML SYRINGE: 0.1 mg/mL | INTRAMUSCULAR | Qty: 10

## 2011-07-24 MED FILL — PLAVIX 75 MG TABLET: 75 mg | ORAL | Qty: 1

## 2011-07-24 MED FILL — AMLODIPINE 10 MG TAB: 10 mg | ORAL | Qty: 1

## 2011-07-24 MED FILL — HYDRALAZINE 25 MG TAB: 25 mg | ORAL | Qty: 1

## 2011-07-24 MED FILL — ADENOSCAN 3 MG/ML INTRAVENOUS SOLUTION: 3 mg/mL | INTRAVENOUS | Qty: 30

## 2011-07-24 MED FILL — SODIUM CHLORIDE 0.9 % IV: INTRAVENOUS | Qty: 100

## 2011-07-24 NOTE — Progress Notes (Signed)
TRANSFER - OUT REPORT:    Verbal report given to cath lab RN on Fuad Forget  being transferred to cath lab for routine progression of care       Report consisted of patient???s Situation, Background, Assessment and   Recommendations(SBAR).     Information from the following report(s) Kardex, Intake/Output and MAR was reviewed with the receiving nurse.    Opportunity for questions and clarification was provided.

## 2011-07-24 NOTE — Progress Notes (Signed)
Tylenol 3 given per patient request for back pain 5/10. Will continue to monitor.

## 2011-07-24 NOTE — Progress Notes (Signed)
Tylenol 3 given for back pain 7/10. Will continue to monitor.

## 2011-07-24 NOTE — Progress Notes (Signed)
Brief Cardiac Procedure Note    Patient: Stephanie Mcglone MRN: [id number]  SSN: [id number]    Date of Birth: 04-Jun-1927  Age: 78 y.o.  Sex: male      Date of Procedure: 07/24/2011     Pre-procedure Diagnosis: Chest Pain CCS Class IV    Post-procedure Diagnosis: Coronary Artery Disease    Procedure: Left Heart Catheterization.  FFR of LAD    Brief Description of Procedure: As above    Performed By: Ebony Hail, MD     Assistants: None    Anesthesia: Moderate Sedation    Estimated Blood Loss: Less than 10 mL      Specimens: None    Implants: None    Findings: Patent SVG to RCA and OM.  Patent LMCA and LAD stent.  60-70% mid to distal eccentric.  FFR 0.86.    Complications: None.  Femoral    Recommendations: Continue medical therapy.    Signed By: Ebony Hail, MD     July 24, 2011

## 2011-07-24 NOTE — Progress Notes (Addendum)
Patient to floor from cath lab. Patient placed on monitor and [facility] for frequent vital signs. Right groin site intact with 6 french sheath to arterial line, without bleeding or hematoma. Pedal pulses palpable. Patient instructed to keep right leg straight and lie flat. Patient verbalized understanding. Will continue to monitor.

## 2011-07-24 NOTE — Progress Notes (Signed)
Report received from Minda Ditto RN. Heparin verified at bedside. Assumed patient care.

## 2011-07-24 NOTE — Progress Notes (Signed)
[facility] PROGRESS NOTE           07/24/2011 8:54 AM    Admit Date: 07/23/2011      Subjective:   Patient notes dyspnea improved.   No chest pain.  No recurrent atrial fibrillation.  BP elevated.   Had only 280 cc diuresis overnight recorded.     ROS:  Cardiovascular:  As noted above    Objective:      Filed Vitals:    07/23/11 2053 07/24/11 0042 07/24/11 0437 07/24/11 0833   BP: 161/74 133/65 161/70 194/95   Pulse: 82 73 73 84   Temp: 98.7 ??F (37.1 ??C) 98.2 ??F (36.8 ??C) 97.8 ??F (36.6 ??C) 98.3 ??F (36.8 ??C)   Resp: 19 18 18 18    Height:       Weight:       SpO2: 95% 90% 92% 90%       Physical Exam:  General-No Acute Distress  Neck- supple, mild JVD  CV- regular rate and rhythm no MRG  Lung- clear bilaterally  Abd- soft, nontender, nondistended  Ext- no edema bilaterally.  Skin- warm and dry      Data Review:   Recent Labs   [facility] 07/24/11 0517 07/23/11 0850    NA 142 144    K 2.7* 2.9*    MG -- --    BUN 10 11    CREA 1.12 1.24    GLU 151* 203*    WBC 4.6 8.4    HGB 8.8* 9.7*    HCT 28.2* 30.6*    PLT 175 184    INR -- --    TRIGL -- --    LDL -- --    HDL -- --     Echo (07/23/11):    - Left ventricle: Systolic function was normal. Ejection fraction was  estimated to be 55 %. Although no diagnostic regional wall motion abnormality  was identified, this possibility cannot be completely excluded on the basis   Of this study. There was moderate concentric hypertrophy.  - Left atrium: The atrium was moderately dilated.  - Atrial septum: There was a probable patent foramen ovale. Agitated saline  contrast injection (bubble study) was performed. There was a mild   right-to-left shunt, in the baseline state.  - Right atrium: The atrium was mildly dilated.  - Mitral valve: There was mild annular calcification. There was mild  regurgitation.  - Tricuspid valve: There was mild regurgitation.          Assessment/Plan:     Principal Problem:   *Acute on chronic diastolic heart failure :  Echo yesterday  with normal LV function.   Had similar admission in 8/12 due to ischemia.  Continue diuresis.   LHC later today to exclude ischemia.  No B-blocker due to history of bradycardia and heart block.      Active Problems:     Elevated troponin : LHC later today.      Hypokalemia :  Replete.  Daily BMp.      Accelerated hypertension :  Add hydralazine.       CAD (coronary artery disease) (07/23/2011)     Acute respiratory failure (07/23/2011)     Atrial fibrillation :  Start amiodarone.  Stop coreg.  Has conduction system disease and likely will not tolerate B-blocker and amiodarone.  ON ASA and plavix.  Given age concerned about triple therapy.      Dyslipidemia :  Continue statin.  Ebony Hail, MD  07/24/2011 8:54 AM

## 2011-07-24 NOTE — Progress Notes (Signed)
Pt HR noted to decreased to 42 bpm, complete AV block noted on telemetry. Pt became nauseous and sightly SOB. After approximately 20 seconds, Pt HR spontaneously returned to 75 bpm, NSR. Paged Dr. Einar Pheasant, [facility] and informed of event. No new orders received at this time.

## 2011-07-24 NOTE — Progress Notes (Signed)
Bedside shift change report given to oncoming nurse Kaylyn Schmitz RN.

## 2011-07-24 NOTE — Progress Notes (Signed)
Oncoming RN made aware of K+ 2.7 and at this time MD has been paged but is not yet aware.

## 2011-07-24 NOTE — Progress Notes (Signed)
6FR arterial sheath removed from right groin using sterile technique. Manual pressure held over site for 20 minutes. Hemostasis achieved at 1720. Right groin without bleeding without hematoma. Sterile gauze placed over site and secured with tegaderm. 5lb sandbag placed over site. Patient instructed to keep right leg straight and still and head on pillow. Patient verbalizes understanding.

## 2011-07-24 NOTE — Other (Signed)
TRANSFER - OUT REPORT:    LHC  Dr. Theressa Millard  RFA access    Versed 2mg , fentanyl  Adenosine for FFR of LAD, negative  No new blockages, patent grafts and stents  6FR sheath RFA sutured in groin and attached to art line  ACT 273 @ 1433    Verbal report given to [facility] RN(name) on Gilbert Nunez  being transferred to tele(unit) for routine progression of care       Report consisted of patient???s Situation, Background, Assessment and   Recommendations(SBAR).     Information from the following report(s) Procedure Summary was reviewed with the receiving nurse.    Opportunity for questions and clarification was provided.

## 2011-07-24 NOTE — Progress Notes (Signed)
TRANSFER - IN REPORT:    Verbal report received from [facility] on Gilbert Nunez  being received from cath lab for routine progression of care      Report consisted of patient???s Situation, Background, Assessment and   Recommendations(SBAR).     Information from the following report(s) Procedure Summary was reviewed with the receiving nurse.    Opportunity for questions and clarification was provided.      Assessment completed upon patient???s arrival to unit and care assumed.

## 2011-07-24 NOTE — Procedures (Signed)
[place]  [facility]                            [address]                           , [place] [address]                                [phone]               [facility] - CATH LAB REPORT    NAME:  Bines, Cutler                             MR:  [id number]  LOC:  [id number]             SEX:  M               ACCT:  [id number]  DOB:  07/02/1927            AGE:  78              PT:  I  ADMIT:  07/23/2011          DSCH:                 MSV:  MED      PROCEDURES  1. Left heart catheterization, selective coronary arteriography, left  ventriculogram.  2. Saphenous vein graft arteriography.  3. Fractional flow reserve of the left anterior descending.    INDICATION: Recurrent diastolic heart failure in a patient with similar  symptoms when he presented with a non-ST elevation myocardial infarction  and had a subtotal occlusion of his left main coronary artery which  required emergent PCI and stenting. Concerns of ischemic mediated  diastolic heart failure and need to establish the stability of his left  main stent.    TECHNICAL FACTORS: After informed consent was obtained, the patient was  brought to the cardiac catheterization lab. The right femoral artery was  prepped and draped in the usual sterile fashion. Utilizing modified  Seldinger technique, a 5-French arterial sheath was placed in the right  common femoral artery. A JL4 catheter was used to selectively engage the  ostium of the left main coronary artery and a 3DRC catheter was used to  selectively engage the ostium of the right coronary artery and saphenous  vein graft to first obtuse marginal artery. Selective injections in  various projections were performed. A multipurpose catheter was used to  engage the saphenous vein graft to RCA. Selective injections in various  projections were performed. Pigtail catheter was used to cross the aortic  valve and enter the left ventricle. Hemodynamic measurements  and left  ventriculogram were obtained. Left ventricular aortic pressure gradient  was obtained by pullback technique.    At the conclusion of the diagnostic procedure, the patient was noted to  have an eccentric stenosis within his mid to distal LAD. It was felt best  to proceed with hemodynamic assessment to see if percutaneous  revascularization would be warranted. At this point, the 5-French sheath  was exchanged for a 6-French system. He had been on intravenous heparin  upstairs and ACT was checked to be 131. He was given 3000 units of  heparin   and subsequent ACT was greater than 2000. A 6-French XB 3.5 guide  was used to selectively engage the ostium of the left main coronary  artery. A PrimeWire was positioned proximally and appropriately  normalized. This was then placed distally in his LAD. The patient was  given adenosine via standard protocol at 140 mcg/kg/min. FFR was measured  at 0.86 indicating hemodynamically insignificant stenosis. The wire was  then removed and final orthogonal projections were obtained showing no  change in angiographic appearance.    The patient tolerated the procedure well without any apparent  complications.    CONTRAST: Optiray 120 mL.    HEMODYNAMIC RESULTS  1. Aortic pressure was 157/71 with a mean of 117.  2. Left ventricular end-diastolic pressure was 20.  3. There was no significant gradient across the aortic valve.    ANGIOGRAPHIC RESULTS  1. Left main coronary artery has been stented. The stent extends into the  proximal LAD. The stent is widely patent with less than 10% in-stent  restenosis.  2. LAD: Medium caliber vessel. Contains a prior implanted stent. There is  a 20% area of focal in-stent restenosis. The stent extends into the mid  vessel. The mid vessel is patent with 20% luminal irregularities. At the  mid to distal transition at an area of tortuosity, there appears to be a  60% to 70% eccentric stenosis. The remainder of the distal vessel appears  patent.  3.  First diagonal artery: Has been jailed by the previously implanted  stent. There is a 60% ostial and a 50% proximal stenosis.  4. Left circumflex is occluded. This fills via the saphenous vein graft.  5. Right coronary artery: 100% proximally occluded.    BYPASS GRAFT ANATOMY  1. Saphenous vein graft to obtuse marginal branch. Large caliber vessel,  20% proximal and mid luminal irregularities. Otherwise, widely patent. It  attaches to a small to medium caliber first obtuse marginal artery which  is patent. There is a 60% stenosis in the very distal portion of the  vessel. The vessel was 2.0 mm at this point. There is retrograde filling  of the more proximal circumflex.  2. Saphenous vein graft to RCA: A medium to large caliber vessel. Patent  with diffuse 25% mid to distal luminal irregularities. It attaches to  what appears to be an RV marginal branch and retrograde fills the right  coronary artery. There is a 70% stenosis in the RV marginal branch prior  to retrograde filling of the RCA. This is unchanged compared to previous  studies.  3. Left ventriculogram performed in RAO projection shows low normal left  ventricular systolic function with EF of 50%. No focal segmental wall  motion abnormalities.    FFR OF THE LAD: 0.86 indicating hemodynamically insignificant stenosis.  PCI was therefore deferred.    CONCLUSION  1. Multivessel coronary artery disease with patent left anterior  descending artery and left main coronary artery stents.  2. Two of two bypass grafts patent.  3. Moderate nonhemodynamically significant stenosis of the mid to distal  LAD based on FFR. FFR is 0.86 indicating hemodynamically insignificant  stenosis.    PLAN: Continue aggressive medical therapy. We will treat for diastolic  heart failure.                Terea Neubauer G. Lorry Anastasi, MD                This is an unverified document unless signed by physician.    TID:    [id number]                                      DT:  07/24/2011  3:10 P  JOB:   [id number]        DOC#:  [id number]           DD:  07/24/2011    cc:   Montez Cuda G. Punam Broussard, MD

## 2011-07-24 NOTE — Progress Notes (Signed)
Bedside and Verbal shift change report given to Larita Fife, Charity fundraiser (oncoming nurse) by Dorris Carnes. Segarra, RN Physiological scientist).  Report given with SBAR, Kardex, Intake/Output, MAR and Recent Results.

## 2011-07-24 NOTE — Other (Signed)
Cath Lab Transfer In    Transferred from tele  Transferred to : Cath Lab Procedure Room 1  Reason for Transfer: LHC poss PCI    Attending physician: Nessmith      Patient Assessment    Patient received into procedure room.  No acute distress noted or verbalized.  Procedural prep completed.  Iv accesses assessed for patency.  Review of pertinent labs and allergies completed.  Noted presence of current History & Physical, updated within the previous 24 hours.  Consent signed.

## 2011-07-24 NOTE — Procedures (Signed)
[facility] [place]  [facility]                            One [address]                           [place], [place]. [address]                                [phone]               [facility] - CATH LAB REPORT    NAME:  Deshane, Cotroneo                             MR:  [id number]  LOC:  [id number]  [id number]             SEX:  M               ACCT:  [id number]  DOB:  [birth year]            AGE:  78              PT:  I  ADMIT:  07/23/2011          DSCH:                 MSV:  MED      PROCEDURES  1. Left heart catheterization, selective coronary arteriography, left  ventriculogram.  2. Saphenous vein graft arteriography.  3. Fractional flow reserve of the left anterior descending.    INDICATION: Recurrent diastolic heart failure in a patient with similar  symptoms when he presented with a non-ST elevation myocardial infarction  and had a subtotal occlusion of his left main coronary artery which  required emergent PCI and stenting. Concerns of ischemic mediated  diastolic heart failure and need to establish the stability of his left  main stent.    TECHNICAL FACTORS: After informed consent was obtained, the patient was  brought to the cardiac catheterization lab. The right femoral artery was  prepped and draped in the usual sterile fashion. Utilizing modified  Seldinger technique, a 5-French arterial sheath was placed in the right  common femoral artery. A JL4 catheter was used to selectively engage the  ostium of the left main coronary artery and a 3DRC catheter was used to  selectively engage the ostium of the right coronary artery and saphenous  vein graft to first obtuse marginal artery. Selective injections in  various projections were performed. A multipurpose catheter was used to  engage the saphenous vein graft to RCA. Selective injections in various  projections were performed. Pigtail catheter was used to cross the aortic  valve and enter the left ventricle. Hemodynamic measurements  and left  ventriculogram were obtained. Left ventricular aortic pressure gradient  was obtained by pullback technique.    At the conclusion of the diagnostic procedure, the patient was noted to  have an eccentric stenosis within his mid to distal LAD. It was felt best  to proceed with hemodynamic assessment to see if percutaneous  revascularization would be warranted. At this point, the 5-French sheath  was exchanged for a 6-French system. He had been on intravenous heparin  upstairs and ACT was checked to be 131. He was given 3000 units of  heparin  and subsequent ACT was greater than 2000. A 6-French XB 3.5 guide  was used to selectively engage the ostium of the left main coronary  artery. A PrimeWire was positioned proximally and appropriately  normalized. This was then placed distally in his LAD. The patient was  given adenosine via standard protocol at 140 mcg/kg/min. FFR was measured  at 0.86 indicating hemodynamically insignificant stenosis. The wire was  then removed and final orthogonal projections were obtained showing no  change in angiographic appearance.    The patient tolerated the procedure well without any apparent  complications.    CONTRAST: Optiray 120 mL.    HEMODYNAMIC RESULTS  1. Aortic pressure was 157/71 with a mean of 117.  2. Left ventricular end-diastolic pressure was 20.  3. There was no significant gradient across the aortic valve.    ANGIOGRAPHIC RESULTS  1. Left main coronary artery has been stented. The stent extends into the  proximal LAD. The stent is widely patent with less than 10% in-stent  restenosis.  2. LAD: Medium caliber vessel. Contains a prior implanted stent. There is  a 20% area of focal in-stent restenosis. The stent extends into the mid  vessel. The mid vessel is patent with 20% luminal irregularities. At the  mid to distal transition at an area of tortuosity, there appears to be a  60% to 70% eccentric stenosis. The remainder of the distal vessel appears  patent.  3.  First diagonal artery: Has been jailed by the previously implanted  stent. There is a 60% ostial and a 50% proximal stenosis.  4. Left circumflex is occluded. This fills via the saphenous vein graft.  5. Right coronary artery: 100% proximally occluded.    BYPASS GRAFT ANATOMY  1. Saphenous vein graft to obtuse marginal branch. Large caliber vessel,  20% proximal and mid luminal irregularities. Otherwise, widely patent. It  attaches to a small to medium caliber first obtuse marginal artery which  is patent. There is a 60% stenosis in the very distal portion of the  vessel. The vessel was 2.0 mm at this point. There is retrograde filling  of the more proximal circumflex.  2. Saphenous vein graft to RCA: A medium to large caliber vessel. Patent  with diffuse 25% mid to distal luminal irregularities. It attaches to  what appears to be an RV marginal branch and retrograde fills the right  coronary artery. There is a 70% stenosis in the RV marginal branch prior  to retrograde filling of the RCA. This is unchanged compared to previous  studies.  3. Left ventriculogram performed in RAO projection shows low normal left  ventricular systolic function with EF of 50%. No focal segmental wall  motion abnormalities.    FFR OF THE LAD: 0.86 indicating hemodynamically insignificant stenosis.  PCI was therefore deferred.    CONCLUSION  1. Multivessel coronary artery disease with patent left anterior  descending artery and left main coronary artery stents.  2. Two of two bypass grafts patent.  3. Moderate nonhemodynamically significant stenosis of the mid to distal  LAD based on FFR. FFR is 0.86 indicating hemodynamically insignificant  stenosis.    PLAN: Continue aggressive medical therapy. We will treat for diastolic  heart failure.                Rhona Raider Derrin Currey, MD                This is an unverified document unless signed by physician.    TID:  [id number]                                      DT:  07/24/2011  3:10 P  JOB:   [id number]        DOC#:  [id number]           DD:  07/24/2011    cc:   Rhona Raider Less Woolsey, MD

## 2011-07-25 LAB — CBC W/O DIFF
HCT: 32.5 % — ABNORMAL LOW (ref 41.1–50.3)
HGB: 10 g/dL — ABNORMAL LOW (ref 13.2–17.1)
MCH: 20 PG — ABNORMAL LOW (ref 26.1–32.9)
MCHC: 30.8 g/dL — ABNORMAL LOW (ref 31.4–35.0)
MCV: 65.1 FL — ABNORMAL LOW (ref 79.6–97.8)
MPV: 10.2 FL — ABNORMAL LOW (ref 10.8–14.1)
PLATELET: 188 10*3/uL (ref 150–450)
RBC: 4.99 M/uL (ref 4.23–5.67)
RDW: 15.1 % — ABNORMAL HIGH (ref 11.9–14.6)
WBC: 4.9 10*3/uL (ref 4.3–11.1)

## 2011-07-25 LAB — METABOLIC PANEL, BASIC
Anion gap: 12 mmol/L (ref 7–16)
BUN: 10 MG/DL (ref 8–23)
CO2: 27 MMOL/L (ref 21–32)
Calcium: 7.9 MG/DL — ABNORMAL LOW (ref 8.3–10.4)
Chloride: 102 MMOL/L (ref 98–107)
Creatinine: 1.22 MG/DL (ref 0.8–1.5)
GFR est AA: >60 mL/min/{1.73_m2} (ref >60)
GFR est non-AA: >60 mL/min/{1.73_m2} (ref >60)
Glucose: 126 MG/DL — ABNORMAL HIGH (ref 65–100)
Potassium: 4 MMOL/L (ref 3.5–5.1)
Sodium: 141 MMOL/L (ref 136–145)

## 2011-07-25 LAB — MAGNESIUM: Magnesium: 2 MG/DL (ref 1.8–2.4)

## 2011-07-25 MED ORDER — FENTANYL CITRATE (PF) 50 MCG/ML IJ SOLN
50 mcg/mL | INTRAMUSCULAR | Status: DC | PRN
Start: 2011-07-25 — End: 2011-07-25
  Administered 2011-07-25 (×2): via INTRAVENOUS

## 2011-07-25 MED ORDER — SODIUM CHLORIDE 0.9 % IV PIGGY BACK
1 gram | Freq: Once | INTRAVENOUS | Status: AC
Start: 2011-07-25 — End: 2011-07-25
  Administered 2011-07-25 (×2): via INTRAVENOUS

## 2011-07-25 MED ORDER — MIDAZOLAM 1 MG/ML IJ SOLN
1 mg/mL | INTRAMUSCULAR | Status: DC | PRN
Start: 2011-07-25 — End: 2011-07-25
  Administered 2011-07-25 (×2): via INTRAVENOUS

## 2011-07-25 MED ADMIN — timolol (TIMOPTIC) 0.25% ophthalmic solution: OPHTHALMIC | @ 21:00:00 | NDC 61314022605

## 2011-07-25 MED ADMIN — amiodarone (CORDARONE) tablet 200 mg: ORAL | @ 03:00:00 | NDC 68084037111

## 2011-07-25 MED ADMIN — bacitracin (BACIIM) 50,000 units/250 ml NS irrigation bag: @ 18:00:00 | NDC 63323032930

## 2011-07-25 MED ADMIN — pravastatin (PRAVACHOL) tablet 40 mg: ORAL | @ 03:00:00 | NDC 68084050111

## 2011-07-25 MED ADMIN — diphenoxylate-atropine (LOMOTIL) tablet 2 Tab: ORAL | @ 21:00:00 | NDC 51079006701

## 2011-07-25 MED ADMIN — aspirin chewable tablet 81 mg: ORAL | @ 12:00:00 | NDC 63739043401

## 2011-07-25 MED ADMIN — latanoprost (XALATAN) 0.005 % ophthalmic solution 1 Drop: OPHTHALMIC | @ 03:00:00 | NDC 61314054701

## 2011-07-25 MED ADMIN — acetaminophen-codeine (TYLENOL #3) per tablet 1 Tab: ORAL | @ 21:00:00 | NDC 00406048462

## 2011-07-25 MED ADMIN — acetaminophen-codeine (TYLENOL #3) per tablet 1 Tab: ORAL | NDC 00406048462

## 2011-07-25 MED ADMIN — amLODIPine (NORVASC) tablet 10 mg: ORAL | @ 12:00:00 | NDC 59762154005

## 2011-07-25 MED ADMIN — diphenoxylate-atropine (LOMOTIL) tablet 2 Tab: ORAL | @ 12:00:00 | NDC 51079006701

## 2011-07-25 MED ADMIN — hydrALAZINE (APRESOLINE) tablet 25 mg: ORAL | @ 03:00:00 | NDC 62584073311

## 2011-07-25 MED ADMIN — hydrALAZINE (APRESOLINE) tablet 25 mg: ORAL | @ 21:00:00 | NDC 62584073311

## 2011-07-25 MED ADMIN — timolol (TIMOPTIC) 0.25% ophthalmic solution: OPHTHALMIC | @ 12:00:00 | NDC 61314022605

## 2011-07-25 MED ADMIN — diphenoxylate-atropine (LOMOTIL) tablet 2 Tab: ORAL | @ 03:00:00 | NDC 51079006701

## 2011-07-25 MED ADMIN — amiodarone (CORDARONE) tablet 200 mg: ORAL | @ 12:00:00 | NDC 68084037111

## 2011-07-25 MED ADMIN — enoxaparin (LOVENOX) injection 40 mg: SUBCUTANEOUS | @ 11:00:00 | NDC 81952012424

## 2011-07-25 MED ADMIN — diphenoxylate-atropine (LOMOTIL) tablet 2 Tab: ORAL | @ 16:00:00 | NDC 51079006701

## 2011-07-25 MED ADMIN — lisinopril (PRINIVIL, ZESTRIL) tablet 30 mg: ORAL | @ 12:00:00 | NDC 68084006211

## 2011-07-25 MED ADMIN — ondansetron (ZOFRAN ODT) tablet 4 mg: ORAL | @ 13:00:00 | NDC 68462015740

## 2011-07-25 MED ADMIN — temazepam (RESTORIL) capsule 30 mg: ORAL | @ 03:00:00 | NDC 51079041901

## 2011-07-25 MED ADMIN — hydrALAZINE (APRESOLINE) tablet 25 mg: ORAL | @ 12:00:00 | NDC 62584073311

## 2011-07-25 MED ADMIN — acetaminophen-codeine (TYLENOL #3) per tablet 1 Tab: ORAL | @ 12:00:00 | NDC 00406048462

## 2011-07-25 MED ADMIN — clopidogrel (PLAVIX) tablet 75 mg: ORAL | @ 12:00:00 | NDC 68084060911

## 2011-07-25 MED FILL — DIPHENOXYLATE-ATROPINE 2.5 MG-0.025 MG TAB: ORAL | Qty: 2

## 2011-07-25 MED FILL — ATROPINE 0.1 MG/ML SYRINGE: 0.1 mg/mL | INTRAMUSCULAR | Qty: 10

## 2011-07-25 MED FILL — CEFAZOLIN 1 GRAM SOLUTION FOR INJECTION: 1 gram | INTRAMUSCULAR | Qty: 1000

## 2011-07-25 MED FILL — HYDRALAZINE 25 MG TAB: 25 mg | ORAL | Qty: 1

## 2011-07-25 MED FILL — ACETAMINOPHEN-CODEINE 300 MG-30 MG TAB: 300-30 mg | ORAL | Qty: 1

## 2011-07-25 MED FILL — AMIODARONE 200 MG TAB: 200 mg | ORAL | Qty: 1

## 2011-07-25 MED FILL — ASPIRIN 81 MG CHEWABLE TAB: 81 mg | ORAL | Qty: 1

## 2011-07-25 MED FILL — MIDAZOLAM 1 MG/ML IJ SOLN: 1 mg/mL | INTRAMUSCULAR | Qty: 2

## 2011-07-25 MED FILL — AMLODIPINE 10 MG TAB: 10 mg | ORAL | Qty: 1

## 2011-07-25 MED FILL — TEMAZEPAM 30 MG CAP: 30 mg | ORAL | Qty: 1

## 2011-07-25 MED FILL — LOVENOX 40 MG/0.4 ML SUBCUTANEOUS SYRINGE: 40 mg/0.4 mL | SUBCUTANEOUS | Qty: 0.4

## 2011-07-25 MED FILL — LISINOPRIL 20 MG TAB: 20 mg | ORAL | Qty: 1

## 2011-07-25 MED FILL — BUMETANIDE 0.25 MG/ML IJ SOLN: 0.25 mg/mL | INTRAMUSCULAR | Qty: 4

## 2011-07-25 MED FILL — CLOPIDOGREL 75 MG TAB: 75 mg | ORAL | Qty: 1

## 2011-07-25 MED FILL — BACITRACIN 50,000 UNIT IM: 50000 unit | INTRAMUSCULAR | Qty: 50000

## 2011-07-25 MED FILL — PRAVASTATIN 20 MG TAB: 20 mg | ORAL | Qty: 2

## 2011-07-25 MED FILL — ONDANSETRON 4 MG TAB, RAPID DISSOLVE: 4 mg | ORAL | Qty: 1

## 2011-07-25 MED FILL — FENTANYL CITRATE (PF) 50 MCG/ML IJ SOLN: 50 mcg/mL | INTRAMUSCULAR | Qty: 2

## 2011-07-25 NOTE — Other (Signed)
TRANSFER - OUT REPORT:    Verbal report given to [facility] RN(name) on Gilbert Nunez  being transferred to CV/TELE(unit) for routine progression of care       Report consisted of patient???s Situation, Background, Assessment and   Recommendations(SBAR).     Information from the following report(s) SBAR was reviewed with the receiving nurse.    Opportunity for questions and clarification was provided.      Nursing Note: Patient to cath lab for upper left chest dual chamber pacemaker placed by Dr. Petra Kuba.  Patient medicated with versed 4mg  and fentanyl for comfort and sedation.  Patient also medicated with Ancef 1gm pre-procedure.  Device settings are DDD with lower and upper limits of 60-120.  Device placed, leads tested and sutured in place and pocket closed with sutures and covered with clear dressing.  No bleeding or hematoma seen.  Patient voiced no concerns or complaints at time of procedure.      Allergies    No Known Allergies    Vitals:    Visit Vitals   Item Reading   ??? BP 157/67   ??? Pulse 86   ??? Temp 97 ??F (36.1 ??C)   ??? Resp 20   ??? Ht 5\' 8"  (1.727 m)   ??? Wt 63.912 kg (140 lb 14.4 oz)   ??? BMI 21.42 kg/m2   ??? SpO2 90%                 Peripheral IV 07/23/11 Right Antecubital (Active)   Site Assessment Clean, dry, & intact 07/25/2011  8:15 AM   Phlebitis Assessment 0 07/25/2011  8:15 AM   Infiltration Assessment 0 07/25/2011  8:15 AM   Dressing Status Clean, dry, & intact 07/25/2011  8:15 AM   Dressing Type Tape;Transparent 07/25/2011  8:15 AM   Hub Color/Line Status Flushed;Capped 07/25/2011  8:15 AM       Peripheral IV 07/25/11 Left Antecubital (Active)

## 2011-07-25 NOTE — Progress Notes (Signed)
TRANSFER - IN REPORT:    Verbal report received from Rex Rubin RN on Gilbert Nunez  being received from cath lab for routine progression of care      Report consisted of patient???s Situation, Background, Assessment and   Recommendations(SBAR).     Information from the following report(s) Procedure Summary was reviewed with the receiving nurse.    Opportunity for questions and clarification was provided.      Assessment completed upon patient???s arrival to unit and care assumed.

## 2011-07-25 NOTE — Progress Notes (Signed)
[facility] PROGRESS NOTE           07/25/2011 8:54 AM    Admit Date: 07/23/2011      Subjective:   Patient notes dyspnea has resolved.    No chest pain.  No recurrent atrial fibrillation on telemetry but episodes of complete heart block noted.  He denies any dizziness or palpations.   BP remains elevated.     ROS:  Cardiovascular:  As noted above    Objective:      Filed Vitals:    07/24/11 2134 07/24/11 2303 07/25/11 0045 07/25/11 0557   BP: 166/70 165/60 111/49 157/67   Pulse: 71 85 60 73   Temp: 97.3 ??F (36.3 ??C)  97.2 ??F (36.2 ??C) 97.8 ??F (36.6 ??C)   Resp: 19  19 19    Height:       Weight:       SpO2: 93%  94% 94%       Physical Exam:  General-No Acute Distress  Neck- supple, mild JVD  CV- regular rate and rhythm no MRG  Lung- bibasilar rales.   Abd- soft, nontender, nondistended  Ext- no edema bilaterally.  Skin- warm and dry      Data Review:   Recent Labs   [facility] 07/25/11 0549 07/24/11 1800 07/24/11 0517    NA 141 -- 142    K 4.0 4.2 --    MG 2.0 1.2* --    BUN 10 -- 10    CREA 1.22 -- 1.12    GLU 126* -- 151*    WBC 4.9 -- 4.6    HGB 10.0* -- 8.8*    HCT 32.5* -- 28.2*    PLT 188 -- 175    INR -- -- --    TRIGL -- -- --    LDL -- -- --    HDL -- -- --     Echo (07/23/11):    - Left ventricle: Systolic function was normal. Ejection fraction was  estimated to be 55 %. Although no diagnostic regional wall motion abnormality  was identified, this possibility cannot be completely excluded on the basis   Of this study. There was moderate concentric hypertrophy.  - Left atrium: The atrium was moderately dilated.  - Atrial septum: There was a probable patent foramen ovale. Agitated saline  contrast injection (bubble study) was performed. There was a mild   right-to-left shunt, in the baseline state.  - Right atrium: The atrium was mildly dilated.  - Mitral valve: There was mild annular calcification. There was mild  regurgitation.  - Tricuspid valve: There was mild regurgitation.           Assessment/Plan:     Principal Problem:   *Acute on chronic diastolic heart failure :  Echo and cath  with normal LV function.   Had similar admission in 8/12 due to ischemia but cath with stable anatomy.  Will change to PO lasix in AM.  Check BMP and BNP in AM.  Add coreg after pacemaker.  See below.      Active Problems:     Elevated troponin : Cath with stable anatomy.  Likely subendocardial ischemia due to diastolic dysfunction.      Hypokalemia :  Resolved.  Daily BMP.      Accelerated hypertension :  Add coreg after pacemaker and titrate.  May be able to stop hydralazine after able to add coreg to simplify regimen.       CAD (coronary artery disease) :  See above.      Acute respiratory failure :  Improved.       Atrial fibrillation :  Has tachy/brady syndrome.  Needs dual chamber PM later today.   Add coreg after.  Will stop amiodarone and titrate coreg initially.   ON ASA and plavix.  Given age concerned about triple therapy. Will avoid coumadin for now.  Will consider if recurrence on pacemaker and complete 1 year of dual anti-platelet therapy (8/13).      Dyslipidemia :  Continue statin.     Complete heart block:  Dual chamber PM with Dr. Petra Kuba today.               Ebony Hail, MD  07/25/2011 8:54 AM

## 2011-07-25 NOTE — Progress Notes (Signed)
Tylenol 3 given for back pain 5/10. Will continue to monitor.

## 2011-07-25 NOTE — Procedures (Signed)
Brief Cardiac Procedure Note    Patient: Gilbert Nunez MRN: [id number]  SSN: [id number]    Date of Birth: 03/18/1928  Age: 78 y.o.  Sex: male      Date of Procedure: 07/25/2011     Pre-procedure Diagnosis: heart block    Post-procedure Diagnosis: same    Procedure: Pacemaker Insertion    Brief Description of Procedure: see dict    Performed By: Cristine Daw W Mina Babula, MD     Assistants: na    Anesthesia: Moderate Sedation    Estimated Blood Loss: Less than 10 mL      Specimens: None    Implants: pacer    Findings: see dict    Complications: None    Recommendations: Continue medical therapy.    Signed By: Clemie General W Carolyn Sylvia, MD     July 25, 2011

## 2011-07-25 NOTE — Other (Signed)
TRANSFER - IN REPORT:    Verbal report received from CV/TELE RN(name) on Gilbert Nunez  being received from CV/TELE(unit) for ordered procedure      Report consisted of patient???s Situation, Background, Assessment and   Recommendations(SBAR).     Information from the following report(s) SBAR was reviewed with the receiving nurse.    Opportunity for questions and clarification was provided.      Assessment completed upon patient???s arrival to unit and care assumed.         Pt is being received for a upper left chest dual chamber pacemaker placed by Dr Petra Kuba    Past Medical History   Diagnosis Date   ??? Actinic keratitis    ??? Opioid dependence    ??? Senile macular degeneration    ??? Glaucoma    ??? Male erectile disorder    ??? Anemia    ??? Vitamin B 12 deficiency    ??? Crohn disease    ??? Osteoarthritis    ??? Coronary artery disease        No Known Allergies    Visit Vitals   Item Reading   ??? BP 157/67   ??? Pulse 86   ??? Temp 97 ??F (36.1 ??C)   ??? Resp 20   ??? Ht 5\' 8"  (1.727 m)   ??? Wt 63.912 kg (140 lb 14.4 oz)   ??? BMI 21.42 kg/m2   ??? SpO2 90%

## 2011-07-25 NOTE — Progress Notes (Signed)
Patient to floor from cath lab. Patient placed on monitor and [facility] for frequent vital signs. Left subclavian pacer site intact without bleeding or hematoma. Radial pulses palpable. Patient instructed to keep left arm relaxed against side. Patient verbalized understanding. Will continue to monitor

## 2011-07-25 NOTE — Procedures (Signed)
[facility]                            [address]                           [place], [place] [address]                                [phone]               [facility] - CATH LAB REPORT    NAME:  Doig, Bertha                             MR:  [id number]  LOC:  [id number]  [id number]             SEX:  M               ACCT:  [id number]  DOB:  04/30/1927            AGE:  78              PT:  I  ADMIT:  07/23/2011          DSCH:                 MSV:  MED      DATE OF PROCEDURE: 07/25/2011    PROCEDURE PERFORMED: Dual-chamber pacemaker implantation.    HISTORY: This is an 78-year-old gentleman with paroxysmal atrial  fibrillation and intermittent complete heart block. A dual-chamber pacer  is recommended.    PROCEDURE: The left infraclavicular fossa was prepped and draped in  sterile manner. The skin and subcutaneous tissue across the deltopectoral  groove was anesthetized with 2% Xylocaine. A pocket was created by sharp  and blunt dissection. Hemostasis was good. The left axillary vein was  localized with an ipsilateral injection of 10 mL contrast. Two sheaths  were subsequently placed by the 1 stick 2 sheath method. Hemostasis was  good. The first lead was placed in the region of the right ventricular  apex. The second lead was placed in the region of the right atrial  appendage. Acute implant parameters were excellent. The leads were then  secured to the pectoral fascia with 2-0 silk suture. The leads were then  connected to the pulse generator and the set screws were set. The pacer  leads were then placed within the pocket. A 2-0 silk anchor suture was  placed at the header. The pocket was then closed by 2 layers of 2-0  subcutaneous Vicryl, followed by a layer of 3-0 subcutaneous Vicryl,  followed by a 4-0 subcuticular Vicryl. Sterile dressing was applied. He  tolerated it well.    The pulse generator is a [employer] [id number], serial number [id number].  The  atrial lead is a [employer] 2088TC, serial number [id number]. The  right ventricular lead is a [employer] 2088TC, serial number  [id number]. The right atrial pacing threshold was 0.5 volts at 0.5  millisecond pulse width with P waves of 1.9 millivolts and lead impedance  of 400 ohms. The right ventricular pacing threshold was 0.5 volts at 0.5  milliseconds with an R-wave of 5.0 millivolts and lead impedance of 580  ohms. There was no   diaphragmatic pacing in either circuit at 10 volts.                Gregory W. Shayaan Parke, MD                This is an unverified document unless signed by physician.    TID:  [id number]                                      DT:  07/25/2011  3:47 P  JOB:  [id number]        DOC#:  [id number]           DD:  07/25/2011    cc:   Gregory W. Charlotte Fidalgo, MD

## 2011-07-25 NOTE — Progress Notes (Signed)
Bedside shift change report given to self (oncoming nurse) by Maurie Boettcher, RN (offgoing nurse).  Report given with SBAR, Kardex, Intake/Output, MAR and Recent Results.

## 2011-07-25 NOTE — Progress Notes (Signed)
Bedside shift change report given to oncoming nurse Kelly Philpott RN.

## 2011-07-25 NOTE — Progress Notes (Signed)
Initial assessment completed.  Chronic pain to back - 3/10.  Medicated with Tylenol #3 at 1725 - decrease in pain noted.  Repositioned.  Left subclavian pacer site WDL.  No swelling.  No bleeding.  Dressing dry and intact.  Left arm immobilized in sling.  Reviewed left arm limitations and precautions.  Voiced understanding.  Urinal at bedside.  Fall precautions in place.  Red socks on.  Instructed to only get OOB with assistance - bedrest until 0500.  Voiced understanding.  Severe vision impairment - electrodes place on call button for easier access.  Call light in reach.

## 2011-07-25 NOTE — Progress Notes (Signed)
CHF teaching started post introduction, will follow post implantation. Pt decreased vision: Planner with readable phone number to contact: 20 mins.

## 2011-07-25 NOTE — Progress Notes (Signed)
TRANSFER - OUT REPORT:    Verbal report given to Deanna RN on Gilbert Nunez  being transferred to cath lab for routine progression of care       Report consisted of patient???s Situation, Background, Assessment and   Recommendations(SBAR).     Information from the following report(s) Kardex, Intake/Output and MAR was reviewed with the receiving nurse.    Opportunity for questions and clarification was provided.

## 2011-07-25 NOTE — Progress Notes (Signed)
Report received from Kaylyn Schmitz RN. Assumed patient care.

## 2011-07-25 NOTE — Procedures (Signed)
Brief Cardiac Procedure Note    Patient: Gilbert Nunez MRN: [id number]  SSN: [id number]    Date of Birth: 12-Jun-1927  Age: 78 y.o.  Sex: male      Date of Procedure: 07/25/2011     Pre-procedure Diagnosis: heart block    Post-procedure Diagnosis: same    Procedure: Pacemaker Insertion    Brief Description of Procedure: see dict    Performed By: Roselind Messier, MD     Assistants: na    Anesthesia: Moderate Sedation    Estimated Blood Loss: Less than 10 mL      Specimens: None    Implants: pacer    Findings: see dict    Complications: None    Recommendations: Continue medical therapy.    Signed By: Roselind Messier, MD     July 25, 2011

## 2011-07-25 NOTE — Procedures (Signed)
[facility]                            [address]                           [place], [place]. [address]                                [phone]               [facility] - CATH LAB REPORT    NAME:  Gilbert Nunez, Gilbert Nunez                             MR:  [id number]  LOC:  03  [id number]             SEX:  M               ACCT:  [id number]  DOB:  1927-10-14            AGE:  78              PT:  I  ADMIT:  07/23/2011          DSCH:                 MSV:  MED      DATE OF PROCEDURE: 07/25/2011    PROCEDURE PERFORMED: Dual-chamber pacemaker implantation.    HISTORY: This is an 78 year old gentleman with paroxysmal atrial  fibrillation and intermittent complete heart block. A dual-chamber pacer  is recommended.    PROCEDURE: The left infraclavicular fossa was prepped and draped in  sterile manner. The skin and subcutaneous tissue across the deltopectoral  groove was anesthetized with 2% Xylocaine. A pocket was created by sharp  and blunt dissection. Hemostasis was good. The left axillary vein was  localized with an ipsilateral injection of 10 mL contrast. Two sheaths  were subsequently placed by the 1 stick 2 sheath method. Hemostasis was  good. The first lead was placed in the region of the right ventricular  apex. The second lead was placed in the region of the right atrial  appendage. Acute implant parameters were excellent. The leads were then  secured to the pectoral fascia with 2-0 silk suture. The leads were then  connected to the pulse generator and the set screws were set. The pacer  leads were then placed within the pocket. A 2-0 silk anchor suture was  placed at the header. The pocket was then closed by 2 layers of 2-0  subcutaneous Vicryl, followed by a layer of 3-0 subcutaneous Vicryl,  followed by a 4-0 subcuticular Vicryl. Sterile dressing was applied. He  tolerated it well.    The pulse generator is a [employer] [id number], serial number [id number].  The  atrial lead is a [employer] [id number], serial number [id number]. The  right ventricular lead is a [employer] [id number], serial number  [id number]. The right atrial pacing threshold was 0.5 volts at 0.5  millisecond pulse width with P waves of 1.9 millivolts and lead impedance  of 400 ohms. The right ventricular pacing threshold was 0.5 volts at 0.5  milliseconds with an R-wave of 5.0 millivolts and lead impedance of 580  ohms. There was no  diaphragmatic pacing in either circuit at 10 volts.                Earvin Hansen, MD                This is an unverified document unless signed by physician.    TID:  [id number]                                      DT:  07/25/2011  3:47 P  JOB:  [id number]        DOC#:  [id number]           DD:  07/25/2011    cc:   Earvin Hansen, MD

## 2011-07-26 LAB — METABOLIC PANEL, BASIC
Anion gap: 9 mmol/L (ref 7–16)
BUN: 17 MG/DL (ref 8–23)
CO2: 32 MMOL/L (ref 21–32)
Calcium: 7.6 MG/DL — ABNORMAL LOW (ref 8.3–10.4)
Chloride: 100 MMOL/L (ref 98–107)
Creatinine: 1.59 MG/DL — ABNORMAL HIGH (ref 0.8–1.5)
GFR est AA: 54 mL/min/{1.73_m2} — ABNORMAL LOW (ref >60)
GFR est non-AA: 45 mL/min/{1.73_m2} — ABNORMAL LOW (ref >60)
Glucose: 116 MG/DL — ABNORMAL HIGH (ref 65–100)
Potassium: 3.5 MMOL/L (ref 3.5–5.1)
Sodium: 141 MMOL/L (ref 136–145)

## 2011-07-26 LAB — CBC W/O DIFF
HCT: 30.1 % — ABNORMAL LOW (ref 41.1–50.3)
HGB: 9.3 g/dL — ABNORMAL LOW (ref 13.2–17.1)
MCH: 20 PG — ABNORMAL LOW (ref 26.1–32.9)
MCHC: 30.9 g/dL — ABNORMAL LOW (ref 31.4–35.0)
MCV: 64.9 FL — ABNORMAL LOW (ref 79.6–97.8)
MPV: 10.4 FL — ABNORMAL LOW (ref 10.8–14.1)
PLATELET: 190 10*3/uL (ref 150–450)
RBC: 4.64 M/uL (ref 4.23–5.67)
RDW: 14.9 % — ABNORMAL HIGH (ref 11.9–14.6)
WBC: 5.2 10*3/uL (ref 4.3–11.1)

## 2011-07-26 MED ADMIN — lisinopril (PRINIVIL, ZESTRIL) tablet 30 mg: ORAL | @ 13:00:00 | NDC 68084006211

## 2011-07-26 MED ADMIN — acetaminophen-codeine (TYLENOL #3) per tablet 1 Tab: ORAL | @ 01:00:00 | NDC 00406048462

## 2011-07-26 MED ADMIN — enoxaparin (LOVENOX) injection 40 mg: SUBCUTANEOUS | @ 12:00:00 | NDC 81952012424

## 2011-07-26 MED ADMIN — hydrALAZINE (APRESOLINE) tablet 25 mg: ORAL | @ 13:00:00 | NDC 62584073311

## 2011-07-26 MED ADMIN — clopidogrel (PLAVIX) tablet 75 mg: ORAL | @ 13:00:00 | NDC 68084060911

## 2011-07-26 MED ADMIN — pravastatin (PRAVACHOL) tablet 40 mg: ORAL | @ 01:00:00 | NDC 68084050111

## 2011-07-26 MED ADMIN — hydrALAZINE (APRESOLINE) tablet 25 mg: ORAL | @ 22:00:00 | NDC 62584073311

## 2011-07-26 MED ADMIN — diphenoxylate-atropine (LOMOTIL) tablet 2 Tab: ORAL | @ 17:00:00 | NDC 51079006701

## 2011-07-26 MED ADMIN — carvedilol (COREG) tablet 6.25 mg: ORAL | @ 13:00:00 | NDC 68084026211

## 2011-07-26 MED ADMIN — amLODIPine (NORVASC) tablet 10 mg: ORAL | @ 13:00:00 | NDC 59762154005

## 2011-07-26 MED ADMIN — diphenoxylate-atropine (LOMOTIL) tablet 2 Tab: ORAL | @ 22:00:00 | NDC 51079006701

## 2011-07-26 MED ADMIN — acetaminophen-codeine (TYLENOL #3) per tablet 1 Tab: ORAL | @ 22:00:00 | NDC 00406048462

## 2011-07-26 MED ADMIN — timolol (TIMOPTIC) 0.25% ophthalmic solution: OPHTHALMIC | @ 13:00:00 | NDC 61314022605

## 2011-07-26 MED ADMIN — temazepam (RESTORIL) capsule 30 mg: ORAL | @ 04:00:00 | NDC 51079041901

## 2011-07-26 MED ADMIN — diphenoxylate-atropine (LOMOTIL) tablet 2 Tab: ORAL | @ 01:00:00 | NDC 51079006701

## 2011-07-26 MED ADMIN — aspirin chewable tablet 81 mg: ORAL | @ 13:00:00 | NDC 63739043401

## 2011-07-26 MED ADMIN — ceFAZolin (ANCEF) 1 g in 0.9% sodium chloride (MBP/ADV) 50 mL MBP: INTRAVENOUS | @ 09:00:00 | NDC 00781345170

## 2011-07-26 MED ADMIN — acetaminophen-codeine (TYLENOL #3) per tablet 1 Tab: ORAL | @ 17:00:00 | NDC 00406048462

## 2011-07-26 MED ADMIN — timolol (TIMOPTIC) 0.25% ophthalmic solution: OPHTHALMIC | @ 22:00:00 | NDC 61314022605

## 2011-07-26 MED ADMIN — ceFAZolin (ANCEF) 1 g in 0.9% sodium chloride (MBP/ADV) 50 mL MBP: INTRAVENOUS | @ 01:00:00 | NDC 00781345170

## 2011-07-26 MED ADMIN — hydrALAZINE (APRESOLINE) tablet 25 mg: ORAL | @ 01:00:00 | NDC 62584073311

## 2011-07-26 MED ADMIN — acetaminophen-codeine (TYLENOL #3) per tablet 1 Tab: ORAL | @ 13:00:00 | NDC 00406048462

## 2011-07-26 MED ADMIN — diphenoxylate-atropine (LOMOTIL) tablet 2 Tab: ORAL | @ 13:00:00 | NDC 51079006701

## 2011-07-26 MED ADMIN — latanoprost (XALATAN) 0.005 % ophthalmic solution 1 Drop: OPHTHALMIC | @ 01:00:00 | NDC 61314054701

## 2011-07-26 MED ADMIN — carvedilol (COREG) tablet 12.5 mg: ORAL | @ 22:00:00 | NDC 68084026311

## 2011-07-26 MED ADMIN — enoxaparin (LOVENOX) injection 30 mg: SUBCUTANEOUS | @ 14:00:00 | NDC 00075062430

## 2011-07-26 MED FILL — CLOPIDOGREL 75 MG TAB: 75 mg | ORAL | Qty: 1

## 2011-07-26 MED FILL — HYDRALAZINE 25 MG TAB: 25 mg | ORAL | Qty: 1

## 2011-07-26 MED FILL — DIPHENOXYLATE-ATROPINE 2.5 MG-0.025 MG TAB: ORAL | Qty: 2

## 2011-07-26 MED FILL — ACETAMINOPHEN-CODEINE 300 MG-30 MG TAB: 300-30 mg | ORAL | Qty: 1

## 2011-07-26 MED FILL — TEMAZEPAM 30 MG CAP: 30 mg | ORAL | Qty: 1

## 2011-07-26 MED FILL — CARVEDILOL 6.25 MG TAB: 6.25 mg | ORAL | Qty: 1

## 2011-07-26 MED FILL — AMLODIPINE 10 MG TAB: 10 mg | ORAL | Qty: 1

## 2011-07-26 MED FILL — SODIUM CHLORIDE 0.9 % IV PIGGY BACK: INTRAVENOUS | Qty: 50

## 2011-07-26 MED FILL — PRAVASTATIN 20 MG TAB: 20 mg | ORAL | Qty: 2

## 2011-07-26 MED FILL — ASPIRIN 81 MG CHEWABLE TAB: 81 mg | ORAL | Qty: 1

## 2011-07-26 MED FILL — LISINOPRIL 20 MG TAB: 20 mg | ORAL | Qty: 1

## 2011-07-26 MED FILL — BUMETANIDE 0.25 MG/ML IJ SOLN: 0.25 mg/mL | INTRAMUSCULAR | Qty: 4

## 2011-07-26 MED FILL — CARVEDILOL 12.5 MG TAB: 12.5 mg | ORAL | Qty: 1

## 2011-07-26 NOTE — Progress Notes (Addendum)
Bedside and Verbal shift change report given to Mardene Celeste, Charity fundraiser (oncoming nurse) by self Kandis Ban nurse).  Report given with SBAR, Kardex, MAR and Recent Results.      Pt refuses to stand for AM weight.  Educated on importance.  States he will get up in a little while.  Oncoming RN informed.

## 2011-07-26 NOTE — Progress Notes (Signed)
CHF teaching completed to pt, verbalize understanding with emphasis on calling MD STAT, if SOB, edema return , daily WTs, salt and fluid restrictions. Planner @ BS. Pass post test, Able to recall [employer] phone number : 1 hr total.

## 2011-07-26 NOTE — Progress Notes (Addendum)
[place]     Pt. Last Name: [name]       Pt. First Name: [address]   MR#: [id number] / Admit#: [id number]   [place], [place] [address]    DOB: 21-May-1927 / Age: 78  Attn.: Nessmith, Rhona Raider.  Location: 03 - [phone]        Case Management - Progress Note  Initial Open Date: 07/25/2011   Case Manager: Enzo Bi, RN,CCM    Initial Open Date: 07/25/2011  Social Worker: Ardine Eng, LMSW    Expected Date of Discharge:   Transferred From: Home  ECF Bed Held Until:   Bed Held By:     Power of Attorney:   POA/Guardian/Conservator Capacity:    Primary Caregiver: self, Jonny Ruiz - son (T# [phone])  Living Arrangements: Own Home    Source of Income: Retirement  Payee:   Psychosocial History:   Cultural/Religious/Language Issues:   Education Level:   ADLS/Current Living Arrangements Issues: Patient lives in own senior-housing   apt in [place] - is legally blind but has been independent with ADLS PTA -   neighbors in building will often assist with transport to MD appts and other   transport needs.    Past Providers: Has white cane used to assist with ambulation.    Will patient perform self care at discharge? Y    Anticipated Discharge Disposition Goal: Return to admission address    Assessment/Plan:        07/26/2011 11:53A  For purposes of completing CHF screening tool - patient has   a PCP (Dr. Lady Gary) that he last saw in March 2013 - states that he is not   homebound - has a car and whenever he can get someone to drive him they will   go out to eat, run errands, etc. - patient does have transportation home if   discharged today - and most likely will tomorrow as well. Roselee Nova [place],   LMSW      07/25/2011 03:44P  Patient is an 78 y/o WWM admitted for sudden onset of   severe SOB and A Fib - now is s/p pacemaker placement today - patient known to   this SW from previous admission last year - lives in senior housing apartment   in [place] - has been independent iwth all ADLS PTA  - has assist from neighbors   with transportation needs to MD appts, etc, - has a white cane which he can   use for walking - some services received through [place] [facility] and has [facility] which also covers medications-  patient is concerned about possible   transport home on Saturday since son who usually assists him works on th   weeekdns (5th floor at [employer]) - SW has placed call to son today informing him of   patient's transport concerns - SW will also f/u with patient re: [facility] he applied for at [facility] last year.   Kem Kays, LMSW            Resources at Discharge:           Service Providers at Discharge:

## 2011-07-26 NOTE — Progress Notes (Signed)
Patient very particular when he receives certain medications.  Wants Xalatan eye gtt when received sleeping pill at 11:30 pm post a evening program he is watching.  Will follow

## 2011-07-26 NOTE — Progress Notes (Signed)
Bedside report of care given to Ann Cooper, RN

## 2011-07-26 NOTE — Progress Notes (Signed)
Verbal bedside report of care received from [place], [place]. Patient's situation, background, assessment, MAR, KARDEX, recent results and MD plans/orders discussed. Opportunity for questions provided. Right groin site assessed as benign

## 2011-07-26 NOTE — Progress Notes (Signed)
[facility] PROGRESS NOTE           07/26/2011 8:54 AM    Admit Date: 07/23/2011      Subjective:   S/P PPM, feeling better today.  No SOB, wants to go home.  BP better.     ROS:  Cardiovascular:  As noted above    Objective:      Filed Vitals:    07/25/11 2127 07/26/11 0105 07/26/11 0531 07/26/11 0930   BP: 112/45 141/61 147/60 139/56   Pulse: 67 71 80 76   Temp: 98.4 ??F (36.9 ??C) 98.2 ??F (36.8 ??C) 98.1 ??F (36.7 ??C) 97.6 ??F (36.4 ??C)   Resp: 18 18 18 20    Height:       Weight:       SpO2: 91% 92% 92% 94%       Physical Exam:  General-No Acute Distress  Neck- supple, mild JVD  CV- regular rate and rhythm no MRG  Lung- bibasilar rales.   Abd- soft, nontender, nondistended  Ext- no edema bilaterally.  Skin- warm and dry      Data Review:   Recent Labs   [facility] 07/26/11 0603 07/25/11 0549 07/24/11 1800    NA 141 141 --    K 3.5 4.0 --    MG -- 2.0 1.2*    BUN 17 10 --    CREA 1.59* 1.22 --    GLU 116* 126* --    WBC 5.2 4.9 --    HGB 9.3* 10.0* --    HCT 30.1* 32.5* --    PLT 190 188 --    INR -- -- --    TRIGL -- -- --    LDL -- -- --    HDL -- -- --     Echo (07/23/11):    - Left ventricle: Systolic function was normal. Ejection fraction was  estimated to be 55 %. Although no diagnostic regional wall motion abnormality  was identified, this possibility cannot be completely excluded on the basis   Of this study. There was moderate concentric hypertrophy.  - Left atrium: The atrium was moderately dilated.  - Atrial septum: There was a probable patent foramen ovale. Agitated saline  contrast injection (bubble study) was performed. There was a mild   right-to-left shunt, in the baseline state.  - Right atrium: The atrium was mildly dilated.  - Mitral valve: There was mild annular calcification. There was mild  regurgitation.  - Tricuspid valve: There was mild regurgitation.          Assessment/Plan:     Principal Problem:   *Acute on chronic diastolic heart failure :  Echo and cath with normal LV  function.   Had similar admission in 8/12 due to ischemia but cath with stable anatomy.  Coreg added back.  Hold diuretics with mild ARF, check Cr again in the AM.  He feels better with far less SOB.     Active Problems:     Elevated troponin : Cath with stable anatomy.  Likely subendocardial ischemia due to diastolic dysfunction.      Hypokalemia :  Resolved.  Daily BMP.      Accelerated hypertension :  Added coreg back, increase today.  May be able to stop hydralazine after able to add coreg to simplify regimen.       CAD (coronary artery disease) : See above.      Acute respiratory failure :  Improved.       Atrial fibrillation :  Has tachy/brady syndrome. S/p PPM and doing well.  Amiodarone stopped and we are uptitrating coreg.  On ASA and plavix.  Given age concerned about triple therapy. Will avoid coumadin for now.  Will consider if recurrence on pacemaker and complete 1 year of dual anti-platelet therapy (8/13).      Dyslipidemia :  Continue statin.     Complete heart block:  S/P PPM.     ARF: Hold diuretics today, encourage PO intake.    Dispo: maybe home [address]          Guss Bunde, DO  07/26/2011 8:54 AM

## 2011-07-27 LAB — METABOLIC PANEL, BASIC
Anion gap: 10 mmol/L (ref 7–16)
BUN: 19 MG/DL (ref 8–23)
CO2: 31 MMOL/L (ref 21–32)
Calcium: 8 MG/DL — ABNORMAL LOW (ref 8.3–10.4)
Chloride: 99 MMOL/L (ref 98–107)
Creatinine: 1.46 MG/DL (ref 0.8–1.5)
GFR est AA: 59 mL/min/{1.73_m2} — ABNORMAL LOW (ref >60)
GFR est non-AA: 49 mL/min/{1.73_m2} — ABNORMAL LOW (ref >60)
Glucose: 119 MG/DL — ABNORMAL HIGH (ref 65–100)
Potassium: 3.6 MMOL/L (ref 3.5–5.1)
Sodium: 140 MMOL/L (ref 136–145)

## 2011-07-27 LAB — MAGNESIUM: Magnesium: 1.9 MG/DL (ref 1.8–2.4)

## 2011-07-27 LAB — BNP: BNP: 128 pg/mL

## 2011-07-27 MED ORDER — HYDRALAZINE 25 MG TAB
25 mg | ORAL_TABLET | Freq: Three times a day (TID) | ORAL | Status: AC
Start: 2011-07-27 — End: 2011-08-26

## 2011-07-27 MED ORDER — CARVEDILOL 12.5 MG TAB
12.5 mg | ORAL_TABLET | Freq: Two times a day (BID) | ORAL | Status: AC
Start: 2011-07-27 — End: ?

## 2011-07-27 MED ORDER — NITROGLYCERIN 0.4 MG SUBLINGUAL TAB
0.4 mg | SUBLINGUAL | Status: AC | PRN
Start: 2011-07-27 — End: ?

## 2011-07-27 MED ORDER — FUROSEMIDE 20 MG TAB
20 mg | ORAL_TABLET | Freq: Every day | ORAL | Status: AC
Start: 2011-07-27 — End: ?

## 2011-07-27 MED ADMIN — pravastatin (PRAVACHOL) tablet 40 mg: ORAL | @ 01:00:00 | NDC 68084050111

## 2011-07-27 MED ADMIN — aspirin chewable tablet 81 mg: ORAL | @ 12:00:00 | NDC 63739043401

## 2011-07-27 MED ADMIN — carvedilol (COREG) tablet 12.5 mg: ORAL | @ 12:00:00 | NDC 68084026311

## 2011-07-27 MED ADMIN — diphenoxylate-atropine (LOMOTIL) tablet 2 Tab: ORAL | @ 12:00:00 | NDC 51079006701

## 2011-07-27 MED ADMIN — diphenoxylate-atropine (LOMOTIL) tablet 2 Tab: ORAL | @ 13:00:00 | NDC 54868151101

## 2011-07-27 MED ADMIN — amLODIPine (NORVASC) tablet 10 mg: ORAL | @ 13:00:00 | NDC 82009002810

## 2011-07-27 MED ADMIN — diphenoxylate-atropine (LOMOTIL) tablet 2 Tab: ORAL | @ 17:00:00 | NDC 51079006701

## 2011-07-27 MED ADMIN — acetaminophen-codeine (TYLENOL #3) per tablet 1 Tab: ORAL | @ 12:00:00 | NDC 00406048462

## 2011-07-27 MED ADMIN — temazepam (RESTORIL) capsule 30 mg: ORAL | @ 04:00:00 | NDC 51079041901

## 2011-07-27 MED ADMIN — lisinopril (PRINIVIL, ZESTRIL) tablet 30 mg: ORAL | @ 12:00:00 | NDC 68084006211

## 2011-07-27 MED ADMIN — diphenoxylate-atropine (LOMOTIL) tablet 2 Tab: ORAL | @ 01:00:00 | NDC 51079006701

## 2011-07-27 MED ADMIN — clopidogrel (PLAVIX) tablet 75 mg: ORAL | @ 13:00:00 | NDC 82009002190

## 2011-07-27 MED ADMIN — aspirin chewable tablet 81 mg: ORAL | @ 13:00:00

## 2011-07-27 MED ADMIN — clopidogrel (PLAVIX) tablet 75 mg: ORAL | @ 12:00:00 | NDC 68084060911

## 2011-07-27 MED ADMIN — timolol (TIMOPTIC) 0.25% ophthalmic solution: OPHTHALMIC | @ 13:00:00 | NDC 73152003001

## 2011-07-27 MED ADMIN — timolol (TIMOPTIC) 0.25% ophthalmic solution: OPHTHALMIC | @ 12:00:00 | NDC 61314022605

## 2011-07-27 MED ADMIN — acetaminophen-codeine (TYLENOL #3) per tablet 1 Tab: ORAL | @ 01:00:00 | NDC 00406048462

## 2011-07-27 MED ADMIN — hydrALAZINE (APRESOLINE) tablet 25 mg: ORAL | @ 12:00:00 | NDC 62584073311

## 2011-07-27 MED ADMIN — hydrALAZINE (APRESOLINE) tablet 25 mg: ORAL | @ 01:00:00 | NDC 62584073311

## 2011-07-27 MED ADMIN — amLODIPine (NORVASC) tablet 10 mg: ORAL | @ 12:00:00 | NDC 59762154005

## 2011-07-27 MED ADMIN — lisinopril (PRINIVIL, ZESTRIL) tablet 30 mg: ORAL | @ 13:00:00 | NDC 76282073205

## 2011-07-27 MED ADMIN — hydrALAZINE (APRESOLINE) tablet 25 mg: ORAL | @ 13:00:00 | NDC 77771018310

## 2011-07-27 MED ADMIN — latanoprost (XALATAN) 0.005 % ophthalmic solution 1 Drop: OPHTHALMIC | @ 04:00:00 | NDC 61314054701

## 2011-07-27 MED FILL — HYDRALAZINE 25 MG TAB: 25 mg | ORAL | Qty: 1

## 2011-07-27 MED FILL — DIPHENOXYLATE-ATROPINE 2.5 MG-0.025 MG TAB: ORAL | Qty: 2

## 2011-07-27 MED FILL — ASPIRIN 81 MG CHEWABLE TAB: 81 mg | ORAL | Qty: 1

## 2011-07-27 MED FILL — TEMAZEPAM 30 MG CAP: 30 mg | ORAL | Qty: 1

## 2011-07-27 MED FILL — CARVEDILOL 12.5 MG TAB: 12.5 mg | ORAL | Qty: 1

## 2011-07-27 MED FILL — CLOPIDOGREL 75 MG TAB: 75 mg | ORAL | Qty: 1

## 2011-07-27 MED FILL — PRAVASTATIN 20 MG TAB: 20 mg | ORAL | Qty: 2

## 2011-07-27 MED FILL — LISINOPRIL 20 MG TAB: 20 mg | ORAL | Qty: 1

## 2011-07-27 MED FILL — AMLODIPINE 10 MG TAB: 10 mg | ORAL | Qty: 1

## 2011-07-27 MED FILL — ACETAMINOPHEN-CODEINE 300 MG-30 MG TAB: 300-30 mg | ORAL | Qty: 1

## 2011-07-27 NOTE — Discharge Summary (Signed)
[facility]                            [address]                           [place], [place]. [address]                                [phone]                                DISCHARGE SUMMARY    NAME: Gilbert Nunez, Gilbert Nunez                              MR:  [id number]  LOC: 03  03331              SEX:  M               ACCT:  [id number]  DOB: 03-20-1928             AGE:  78              PT:  I  ADMIT: 07/23/2011           DSCH:                 MSV:  MED      ADMISSION DIAGNOSIS: Shortness of breath.    DISCHARGE DIAGNOSES  1. Acute on chronic diastolic heart failure.  2. Elevated troponin.  3. Hypokalemia.  4. Accelerated hypertension.  5. Coronary artery disease.  6. Acute respiratory failure.  7. Atrial fibrillation.  8. Dyslipidemia.  9. Complete heart block.    HOSPITAL COURSE: An 78 year old male with recurrent diastolic heart  failure and similar symptoms presented to the ER with non-ST-elevation  myocardial infarction and had a subtotal occlusion of his left main  coronary artery, which required emergent PCI and stenting. Also concerns  of ischemic mediated diastolic heart failure, need to establish stability  of his left main stent, was taken to the cath lab emergently by Dr.  Rhona Raider. Nessmith where he had an FFR of the LAD 0.86 which indicated  hemodynamically insignificant stenosis. PCI was therefore deferred.  However, he had findings of multivessel coronary artery disease with  patent left anterior descending artery and left main coronary artery  stents, 2/2 bypass grafts patent, moderate nonhemodynamically significant  stenosis of the mid to distal LAD based on FFR as stated. It was  recommended that the patient continue with aggressive medical therapy and  he was treated for diastolic heart failure. He also had paroxysmal atrial  fibrillation and intermittent complete heart block, so a St. Jude  dual-chamber pacemaker was placed by Dr. Clint Lipps. This was   interrogated on the day of discharge and found to be functioning  adequately. Echocardiogram performed as well revealed an ejection  fraction at 55% initially with no diagnostic regional wall motion  abnormality. The patient's troponin was elevated and this was thought to  be related to supply/demand mismatch. The patient was treated with  aggressive medical therapy appropriate and improved. On the day of  discharge, he was seen and evaluated by Dr. Lenard Simmer and determined  stable and ready for  discharge.    PRIMARY CARE PROVIDER: Dr. Remigio Eisenmenger    CONSULTS: Social Work consult.    SIGNIFICANT DIAGNOSTIC STUDIES: Placement of St. Jude dual-chamber  pacemaker, left heart catheterization, echocardiogram as described.    DISCHARGE EXAM: Please refer to physical exam and progress notes.    DISPOSITION: The patient will be discharged home in stable condition.    DISCHARGE MEDICATIONS: Please refer to medication reconciliation in  [facility].    ACTIVITY: The patient is given activity restrictions regarding his  pacemaker placement as well as right groin access for his left heart  catheterization. He will observe both sites for any signs of infection  and these are discussed with him and he is aware of what to do should  they occur.    DIET: Will be low-fat, low-salt, low-cholesterol and the emphasis of  daily weights is discussed with patient. The patient will be contacted by  the [facility] office. A message has been left with the  scheduling department as he is a transitional care patient and will be  followed up this week with Dr. Rhona Raider. Nessmith, and he will have a  BMP prior to his appointment. He will also follow up with his primary  care provider, Dr. Remigio Eisenmenger, in 3 to 4 weeks or as previously scheduled.        Dictated By: Caswell Corwin, PA            Guss Bunde, MD                This is an unverified document unless signed by physician.    TID: [id number]                                        DT: 07/27/2011  1:40 P  JOB: [id number]         DOC#: [id number]            DD: 07/27/2011    cc:   Romie Minus, MD        Guss Bunde, MD

## 2011-07-27 NOTE — Discharge Summary (Signed)
See dictated discharge summary # [id number].

## 2011-07-27 NOTE — Progress Notes (Signed)
Discharge instructions, RX's,medicine information sheets and home care information sheets given. Patient and his family verbalized understanding of instructions.Questions and concerns addressed. Patient stable and discharged to home via wheelchair in private automobile with family. Patient stable and Left subclavian site is benign at discharge

## 2011-07-27 NOTE — Progress Notes (Signed)
Patient and caregiver confused regarding set-up of St. Jude ICD Home Monitor at [phone]. St. Jude Rep. Called and transferred into patient's room 330 to explain to caregiver the process for setting up the home monitoring system.

## 2011-07-27 NOTE — Progress Notes (Signed)
[facility] PROGRESS NOTE           07/27/2011 8:54 AM    Admit Date: 07/23/2011      Subjective:   S/P PPM, feeling better today.  No SOB, wants to go home.  BP better.     ROS:  Cardiovascular:  As noted above    Objective:      Filed Vitals:    07/26/11 1632 07/26/11 2037 07/27/11 0109 07/27/11 0528   BP: 136/52 140/58 128/52 151/59   Pulse: 68 67 64 69   Temp: 97.7 ??F (36.5 ??C) 97.8 ??F (36.6 ??C) 97.5 ??F (36.4 ??C) 96.9 ??F (36.1 ??C)   Resp: 16 18 16 16    Height:       Weight:    60.374 kg (133 lb 1.6 oz)   SpO2: 91% 92% 94% 96%       Physical Exam:  General-No Acute Distress  Neck- supple, mild JVD  CV- regular rate and rhythm no MRG  Lung- bibasilar rales.   Abd- soft, nontender, nondistended  Ext- no edema bilaterally.  Skin- warm and dry      Data Review:   Recent Labs   [facility] 07/27/11 0350 07/26/11 0603 07/25/11 0549    NA 140 141 --    K 3.6 3.5 --    MG 1.9 -- 2.0    BUN 19 17 --    CREA 1.46 1.59* --    GLU 119* 116* --    WBC -- 5.2 4.9    HGB -- 9.3* 10.0*    HCT -- 30.1* 32.5*    PLT -- 190 188    INR -- -- --    TRIGL -- -- --    LDL -- -- --    HDL -- -- --     Echo (07/23/11):    - Left ventricle: Systolic function was normal. Ejection fraction was  estimated to be 55 %. Although no diagnostic regional wall motion abnormality  was identified, this possibility cannot be completely excluded on the basis   Of this study. There was moderate concentric hypertrophy.  - Left atrium: The atrium was moderately dilated.  - Atrial septum: There was a probable patent foramen ovale. Agitated saline  contrast injection (bubble study) was performed. There was a mild   right-to-left shunt, in the baseline state.  - Right atrium: The atrium was mildly dilated.  - Mitral valve: There was mild annular calcification. There was mild  regurgitation.  - Tricuspid valve: There was mild regurgitation.          Assessment/Plan:     Principal Problem:   *Acute on chronic diastolic heart failure :  Echo  and cath with normal LV function.   Had similar admission in 8/12 due to ischemia but cath with stable anatomy.  Coreg added back.  Add back lasix 20mg  po qAM.  He feels better with far less SOB.     Active Problems:     Elevated troponin : Cath with stable anatomy.  Likely subendocardial ischemia due to diastolic dysfunction.      Hypokalemia :  Resolved.  Daily BMP.      Accelerated hypertension :  Added coreg back, increased yesterday.  May be able to stop hydralazine as outpatient.       CAD (coronary artery disease) : See above.      Acute respiratory failure :  Improved.       Atrial fibrillation :  Has tachy/brady syndrome. S/p  PPM and doing well.  Amiodarone stopped and we are uptitrating coreg.  On ASA and plavix.  Given age concerned about triple therapy. Will avoid coumadin for now.  Will consider if recurrence on pacemaker and complete 1 year of dual anti-platelet therapy (8/13).      Dyslipidemia :  Continue statin.     Complete heart block:  S/P PPM.     ARF: Hold diuretics today, encourage PO intake.      Home Today  Add Back lasix 20 qday and more as needed for daily weights.  TC-7 Appt with Dr. Theressa Millard, BMP before follow up with Matt  Low salt diet and daily weights stressed          Guss Bunde, DO  07/27/2011 8:54 AM

## 2011-07-27 NOTE — Progress Notes (Signed)
Verbal bedside report of care received from  Katie, RN. Patient's situation, background, assessment, MAR, KARDEX, recent results and MD plans/orders discussed. Opportunity for questions provided.

## 2015-12-04 NOTE — Progress Notes (Signed)
This note will not be viewable in MyChart.      The device clinic chart in relation to Gilbert Nunez has been sent to storage in [id number].     Gilbert RivalHelen J Nunez  12/04/2015  9:20 AM

## 2017-01-08 ENCOUNTER — Encounter (HOSPITAL_COMMUNITY): Admission: EM | Disposition: E | Payer: Self-pay | Source: Home / Self Care | Attending: Neurosurgery

## 2017-01-08 ENCOUNTER — Emergency Department (HOSPITAL_COMMUNITY): Payer: Medicare PPO

## 2017-01-08 ENCOUNTER — Emergency Department (HOSPITAL_COMMUNITY): Payer: Medicare PPO | Admitting: Anesthesiology

## 2017-01-08 ENCOUNTER — Inpatient Hospital Stay (HOSPITAL_COMMUNITY): Payer: Medicare PPO

## 2017-01-08 ENCOUNTER — Inpatient Hospital Stay (HOSPITAL_COMMUNITY)
Admission: EM | Admit: 2017-01-08 | Discharge: 2017-01-20 | DRG: 025 | Disposition: E | Payer: Medicare PPO | Attending: Neurosurgery | Admitting: Neurosurgery

## 2017-01-08 ENCOUNTER — Encounter (HOSPITAL_COMMUNITY): Payer: Self-pay | Admitting: Emergency Medicine

## 2017-01-08 DIAGNOSIS — Z7902 Long term (current) use of antithrombotics/antiplatelets: Secondary | ICD-10-CM

## 2017-01-08 DIAGNOSIS — J9601 Acute respiratory failure with hypoxia: Secondary | ICD-10-CM | POA: Diagnosis not present

## 2017-01-08 DIAGNOSIS — I2510 Atherosclerotic heart disease of native coronary artery without angina pectoris: Secondary | ICD-10-CM | POA: Diagnosis present

## 2017-01-08 DIAGNOSIS — G40409 Other generalized epilepsy and epileptic syndromes, not intractable, without status epilepticus: Secondary | ICD-10-CM | POA: Diagnosis not present

## 2017-01-08 DIAGNOSIS — Z781 Physical restraint status: Secondary | ICD-10-CM

## 2017-01-08 DIAGNOSIS — E876 Hypokalemia: Secondary | ICD-10-CM | POA: Diagnosis not present

## 2017-01-08 DIAGNOSIS — S065X9A Traumatic subdural hemorrhage with loss of consciousness of unspecified duration, initial encounter: Secondary | ICD-10-CM | POA: Diagnosis present

## 2017-01-08 DIAGNOSIS — Z66 Do not resuscitate: Secondary | ICD-10-CM | POA: Diagnosis not present

## 2017-01-08 DIAGNOSIS — I10 Essential (primary) hypertension: Secondary | ICD-10-CM | POA: Diagnosis present

## 2017-01-08 DIAGNOSIS — G9340 Encephalopathy, unspecified: Secondary | ICD-10-CM

## 2017-01-08 DIAGNOSIS — Z01818 Encounter for other preprocedural examination: Secondary | ICD-10-CM

## 2017-01-08 DIAGNOSIS — S065X0A Traumatic subdural hemorrhage without loss of consciousness, initial encounter: Secondary | ICD-10-CM | POA: Diagnosis present

## 2017-01-08 DIAGNOSIS — W01190A Fall on same level from slipping, tripping and stumbling with subsequent striking against furniture, initial encounter: Secondary | ICD-10-CM | POA: Diagnosis present

## 2017-01-08 DIAGNOSIS — I6200 Nontraumatic subdural hemorrhage, unspecified: Secondary | ICD-10-CM | POA: Diagnosis present

## 2017-01-08 DIAGNOSIS — Z950 Presence of cardiac pacemaker: Secondary | ICD-10-CM

## 2017-01-08 DIAGNOSIS — R569 Unspecified convulsions: Secondary | ICD-10-CM | POA: Diagnosis not present

## 2017-01-08 DIAGNOSIS — Z515 Encounter for palliative care: Secondary | ICD-10-CM | POA: Diagnosis not present

## 2017-01-08 DIAGNOSIS — Z955 Presence of coronary angioplasty implant and graft: Secondary | ICD-10-CM

## 2017-01-08 DIAGNOSIS — Z951 Presence of aortocoronary bypass graft: Secondary | ICD-10-CM | POA: Diagnosis not present

## 2017-01-08 DIAGNOSIS — Y9259 Other trade areas as the place of occurrence of the external cause: Secondary | ICD-10-CM

## 2017-01-08 DIAGNOSIS — J449 Chronic obstructive pulmonary disease, unspecified: Secondary | ICD-10-CM | POA: Diagnosis present

## 2017-01-08 DIAGNOSIS — D649 Anemia, unspecified: Secondary | ICD-10-CM | POA: Diagnosis present

## 2017-01-08 DIAGNOSIS — D696 Thrombocytopenia, unspecified: Secondary | ICD-10-CM | POA: Diagnosis present

## 2017-01-08 DIAGNOSIS — R34 Anuria and oliguria: Secondary | ICD-10-CM | POA: Diagnosis present

## 2017-01-08 DIAGNOSIS — R579 Shock, unspecified: Secondary | ICD-10-CM | POA: Diagnosis not present

## 2017-01-08 DIAGNOSIS — W19XXXA Unspecified fall, initial encounter: Secondary | ICD-10-CM

## 2017-01-08 DIAGNOSIS — S065XAA Traumatic subdural hemorrhage with loss of consciousness status unknown, initial encounter: Secondary | ICD-10-CM | POA: Diagnosis present

## 2017-01-08 DIAGNOSIS — Z9289 Personal history of other medical treatment: Secondary | ICD-10-CM

## 2017-01-08 HISTORY — DX: Anemia, unspecified: D64.9

## 2017-01-08 HISTORY — DX: Atherosclerotic heart disease of native coronary artery without angina pectoris: I25.10

## 2017-01-08 HISTORY — DX: Presence of cardiac pacemaker: Z95.0

## 2017-01-08 HISTORY — PX: CRANIOTOMY: SHX93

## 2017-01-08 HISTORY — DX: Essential (primary) hypertension: I10

## 2017-01-08 HISTORY — DX: Chronic obstructive pulmonary disease, unspecified: J44.9

## 2017-01-08 LAB — HEMOGLOBIN AND HEMATOCRIT, BLOOD
HCT: 29.7 % — ABNORMAL LOW (ref 39.0–52.0)
Hemoglobin: 9.6 g/dL — ABNORMAL LOW (ref 13.0–17.0)

## 2017-01-08 LAB — BASIC METABOLIC PANEL
ANION GAP: 11 (ref 5–15)
Anion gap: 11 (ref 5–15)
BUN: 13 mg/dL (ref 6–20)
BUN: 13 mg/dL (ref 6–20)
CALCIUM: 8.3 mg/dL — AB (ref 8.9–10.3)
CHLORIDE: 106 mmol/L (ref 101–111)
CO2: 22 mmol/L (ref 22–32)
CO2: 19 mmol/L — AB (ref 22–32)
CREATININE: 1.23 mg/dL (ref 0.61–1.24)
CREATININE: 1.29 mg/dL — AB (ref 0.61–1.24)
Calcium: 7.2 mg/dL — ABNORMAL LOW (ref 8.9–10.3)
Chloride: 102 mmol/L (ref 101–111)
GFR calc Af Amer: 59 mL/min — ABNORMAL LOW (ref >60)
GFR calc Af Amer: 56 mL/min — ABNORMAL LOW (ref >60)
GFR calc non Af Amer: 48 mL/min — ABNORMAL LOW (ref >60)
GFR, EST NON AFRICAN AMERICAN: 51 mL/min — AB (ref >60)
Glucose, Bld: 201 mg/dL — ABNORMAL HIGH (ref 65–99)
Glucose, Bld: 280 mg/dL — ABNORMAL HIGH (ref 65–99)
Potassium: 3.1 mmol/L — ABNORMAL LOW (ref 3.5–5.1)
Potassium: 3.2 mmol/L — ABNORMAL LOW (ref 3.5–5.1)
SODIUM: 135 mmol/L (ref 135–145)
SODIUM: 136 mmol/L (ref 135–145)

## 2017-01-08 LAB — CBC
HEMATOCRIT: 21.5 % — AB (ref 39.0–52.0)
HEMOGLOBIN: 6.7 g/dL — AB (ref 13.0–17.0)
MCH: 20.4 pg — AB (ref 26.0–34.0)
MCHC: 31.2 g/dL (ref 30.0–36.0)
MCV: 65.3 fL — ABNORMAL LOW (ref 78.0–100.0)
Platelets: 85 10*3/uL — ABNORMAL LOW (ref 150–400)
RBC: 3.29 MIL/uL — ABNORMAL LOW (ref 4.22–5.81)
RDW: 15.8 % — ABNORMAL HIGH (ref 11.5–15.5)
WBC: 6.6 10*3/uL (ref 4.0–10.5)

## 2017-01-08 LAB — BLOOD GAS, ARTERIAL
Acid-base deficit: 0.8 mmol/L (ref 0.0–2.0)
Bicarbonate: 22.8 mmol/L (ref 20.0–28.0)
DRAWN BY: 280981
FIO2: 100
O2 SAT: 99.5 %
PCO2 ART: 34.1 mmHg (ref 32.0–48.0)
PEEP: 5 cmH2O
PH ART: 7.441 (ref 7.350–7.450)
Patient temperature: 98.6
RATE: 16 resp/min
VT: 550 mL
pO2, Arterial: 330 mmHg — ABNORMAL HIGH (ref 83.0–108.0)

## 2017-01-08 LAB — CBC WITH DIFFERENTIAL/PLATELET
BASOS ABS: 0 10*3/uL (ref 0.0–0.1)
Basophils Relative: 0 %
EOS ABS: 0.1 10*3/uL (ref 0.0–0.7)
Eosinophils Relative: 1 %
HCT: 30.2 % — ABNORMAL LOW (ref 39.0–52.0)
Hemoglobin: 9.5 g/dL — ABNORMAL LOW (ref 13.0–17.0)
LYMPHS ABS: 0.9 10*3/uL (ref 0.7–4.0)
Lymphocytes Relative: 10 %
MCH: 20.4 pg — ABNORMAL LOW (ref 26.0–34.0)
MCHC: 31.5 g/dL (ref 30.0–36.0)
MCV: 64.9 fL — ABNORMAL LOW (ref 78.0–100.0)
MONO ABS: 0.6 10*3/uL (ref 0.1–1.0)
MONOS PCT: 7 %
Neutro Abs: 7 10*3/uL (ref 1.7–7.7)
Neutrophils Relative %: 82 %
PLATELETS: 96 10*3/uL — AB (ref 150–400)
RBC: 4.65 MIL/uL (ref 4.22–5.81)
RDW: 15.8 % — AB (ref 11.5–15.5)
WBC: 8.6 10*3/uL (ref 4.0–10.5)

## 2017-01-08 LAB — GLUCOSE, CAPILLARY
Glucose-Capillary: 322 mg/dL — ABNORMAL HIGH (ref 65–99)
Glucose-Capillary: 310 mg/dL — ABNORMAL HIGH (ref 65–99)

## 2017-01-08 LAB — I-STAT TROPONIN, ED: Troponin i, poc: 0.04 ng/mL (ref 0.00–0.08)

## 2017-01-08 LAB — PHOSPHORUS
PHOSPHORUS: 4.4 mg/dL (ref 2.5–4.6)
PHOSPHORUS: 2.1 mg/dL — AB (ref 2.5–4.6)

## 2017-01-08 LAB — MRSA PCR SCREENING: MRSA BY PCR: NEGATIVE

## 2017-01-08 LAB — PROTIME-INR
INR: 1.09
Prothrombin Time: 14 seconds (ref 11.4–15.2)

## 2017-01-08 LAB — PREPARE RBC (CROSSMATCH)

## 2017-01-08 LAB — ABO/RH: ABO/RH(D): B POS

## 2017-01-08 LAB — MAGNESIUM
MAGNESIUM: 1.4 mg/dL — AB (ref 1.7–2.4)
Magnesium: 1.5 mg/dL — ABNORMAL LOW (ref 1.7–2.4)

## 2017-01-08 LAB — TROPONIN I: TROPONIN I: 0.05 ng/mL — AB (ref <0.03)

## 2017-01-08 SURGERY — CRANIOTOMY HEMATOMA EVACUATION SUBDURAL
Anesthesia: General | Site: Head | Laterality: Right

## 2017-01-08 MED ORDER — THROMBIN 20000 UNITS EX SOLR
CUTANEOUS | Status: DC | PRN
  Administered 2017-01-08: 08:00:00 via TOPICAL

## 2017-01-08 MED ORDER — 0.9 % SODIUM CHLORIDE (POUR BTL) OPTIME
TOPICAL | Status: DC | PRN
  Administered 2017-01-08 (×3): 1000 mL

## 2017-01-08 MED ORDER — CEFAZOLIN SODIUM-DEXTROSE 1-4 GM/50ML-% IV SOLN
1.0000 g | Freq: Three times a day (TID) | INTRAVENOUS | Status: AC
  Administered 2017-01-08 (×2): 1 g via INTRAVENOUS
  Filled 2017-01-08 (×2): qty 50

## 2017-01-08 MED ORDER — SODIUM CHLORIDE 0.9 % IV SOLN
INTRAVENOUS | Status: DC
  Administered 2017-01-09: 2 [IU]/h via INTRAVENOUS
  Filled 2017-01-08: qty 1

## 2017-01-08 MED ORDER — LIDOCAINE 2% (20 MG/ML) 5 ML SYRINGE
INTRAMUSCULAR | Status: AC
  Filled 2017-01-08: qty 5

## 2017-01-08 MED ORDER — PRO-STAT SUGAR FREE PO LIQD
30.0000 mL | Freq: Two times a day (BID) | ORAL | Status: DC
  Administered 2017-01-08: 30 mL
  Filled 2017-01-08: qty 30

## 2017-01-08 MED ORDER — INSULIN ASPART 100 UNIT/ML ~~LOC~~ SOLN
0.0000 [IU] | SUBCUTANEOUS | Status: DC
  Administered 2017-01-08: 11 [IU] via SUBCUTANEOUS

## 2017-01-08 MED ORDER — HYDROMORPHONE HCL 1 MG/ML IJ SOLN
0.5000 mg | INTRAMUSCULAR | Status: DC | PRN
  Administered 2017-01-09: 0.5 mg via INTRAVENOUS
  Administered 2017-01-10: 1 mg via INTRAVENOUS
  Filled 2017-01-08: qty 0.5
  Filled 2017-01-08: qty 1

## 2017-01-08 MED ORDER — SODIUM CHLORIDE 0.9 % IV SOLN
150.0000 mg | Freq: Two times a day (BID) | INTRAVENOUS | Status: DC
  Administered 2017-01-08 – 2017-01-10 (×4): 150 mg via INTRAVENOUS
  Filled 2017-01-08 (×10): qty 3

## 2017-01-08 MED ORDER — THROMBIN 20000 UNITS EX SOLR
CUTANEOUS | Status: AC
  Filled 2017-01-08: qty 20000

## 2017-01-08 MED ORDER — FENTANYL CITRATE (PF) 100 MCG/2ML IJ SOLN
50.0000 ug | Freq: Once | INTRAMUSCULAR | Status: AC
  Administered 2017-01-08: 50 ug via INTRAVENOUS
  Filled 2017-01-08: qty 2

## 2017-01-08 MED ORDER — ONDANSETRON HCL 4 MG/2ML IJ SOLN: 4.0000 mg | Freq: Once | INTRAMUSCULAR | Status: DC | PRN

## 2017-01-08 MED ORDER — ONDANSETRON HCL 4 MG/2ML IJ SOLN: 4.0000 mg | INTRAMUSCULAR | Status: DC | PRN

## 2017-01-08 MED ORDER — NALOXONE HCL 0.4 MG/ML IJ SOLN: 0.0800 mg | INTRAMUSCULAR | Status: DC | PRN

## 2017-01-08 MED ORDER — POTASSIUM CHLORIDE 20 MEQ/15ML (10%) PO SOLN
40.0000 meq | Freq: Once | ORAL | Status: AC
  Administered 2017-01-08: 40 meq
  Filled 2017-01-08: qty 30

## 2017-01-08 MED ORDER — SUGAMMADEX SODIUM 200 MG/2ML IV SOLN
INTRAVENOUS | Status: AC
  Filled 2017-01-08: qty 2

## 2017-01-08 MED ORDER — LIDOCAINE-EPINEPHRINE 1 %-1:100000 IJ SOLN
INTRAMUSCULAR | Status: AC
  Filled 2017-01-08: qty 1

## 2017-01-08 MED ORDER — CEFAZOLIN SODIUM-DEXTROSE 2-4 GM/100ML-% IV SOLN
2.0000 g | INTRAVENOUS | Status: AC
  Administered 2017-01-08: 2 g via INTRAVENOUS

## 2017-01-08 MED ORDER — SUGAMMADEX SODIUM 200 MG/2ML IV SOLN
INTRAVENOUS | Status: DC | PRN
  Administered 2017-01-08: 200 mg via INTRAVENOUS

## 2017-01-08 MED ORDER — PROMETHAZINE HCL 25 MG PO TABS: 12.5000 mg | ORAL_TABLET | ORAL | Status: DC | PRN

## 2017-01-08 MED ORDER — ONDANSETRON HCL 4 MG/2ML IJ SOLN
INTRAMUSCULAR | Status: AC
Start: 2017-01-08 — End: 2017-01-08
  Filled 2017-01-08: qty 2

## 2017-01-08 MED ORDER — PANTOPRAZOLE SODIUM 40 MG IV SOLR
40.0000 mg | Freq: Every day | INTRAVENOUS | Status: DC
  Administered 2017-01-08 – 2017-01-09 (×2): 40 mg via INTRAVENOUS
  Filled 2017-01-08 (×2): qty 40

## 2017-01-08 MED ORDER — FENTANYL CITRATE (PF) 100 MCG/2ML IJ SOLN
25.0000 ug | INTRAMUSCULAR | Status: DC | PRN
  Administered 2017-01-08 – 2017-01-10 (×6): 50 ug via INTRAVENOUS
  Filled 2017-01-08 (×5): qty 2

## 2017-01-08 MED ORDER — SODIUM CHLORIDE 0.9 % IV SOLN
INTRAVENOUS | Status: DC | PRN
  Administered 2017-01-08: 07:00:00 via INTRAVENOUS

## 2017-01-08 MED ORDER — ORAL CARE MOUTH RINSE
15.0000 mL | OROMUCOSAL | Status: DC
  Administered 2017-01-08 – 2017-01-09 (×8): 15 mL via OROMUCOSAL

## 2017-01-08 MED ORDER — SODIUM CHLORIDE 0.9 % IV BOLUS (SEPSIS)
1000.0000 mL | Freq: Once | INTRAVENOUS | Status: AC
  Administered 2017-01-08: 1000 mL via INTRAVENOUS

## 2017-01-08 MED ORDER — NOREPINEPHRINE BITARTRATE 1 MG/ML IV SOLN
0.0000 ug/min | INTRAVENOUS | Status: DC
  Administered 2017-01-08: 2 ug/min via INTRAVENOUS
  Filled 2017-01-08: qty 4

## 2017-01-08 MED ORDER — ONDANSETRON HCL 4 MG PO TABS: 4.0000 mg | ORAL_TABLET | ORAL | Status: DC | PRN

## 2017-01-08 MED ORDER — ROCURONIUM BROMIDE 50 MG/5ML IV SOLN
50.0000 mg | Freq: Once | INTRAVENOUS | Status: AC
  Administered 2017-01-08: 50 mg via INTRAVENOUS

## 2017-01-08 MED ORDER — ACETAMINOPHEN 325 MG PO TABS: 650.0000 mg | ORAL_TABLET | ORAL | Status: DC | PRN

## 2017-01-08 MED ORDER — SODIUM CHLORIDE 0.9 % IV SOLN
INTRAVENOUS | Status: DC
  Administered 2017-01-08 – 2017-01-09 (×3): via INTRAVENOUS

## 2017-01-08 MED ORDER — PROPOFOL 10 MG/ML IV BOLUS
INTRAVENOUS | Status: DC | PRN
Start: 2017-01-08 — End: 2017-01-08
  Administered 2017-01-08: 80 mg via INTRAVENOUS

## 2017-01-08 MED ORDER — CEFAZOLIN SODIUM 1 G IJ SOLR
INTRAMUSCULAR | Status: AC
  Filled 2017-01-08: qty 20

## 2017-01-08 MED ORDER — FENTANYL CITRATE (PF) 100 MCG/2ML IJ SOLN
INTRAMUSCULAR | Status: DC | PRN
  Administered 2017-01-08: 100 ug via INTRAVENOUS
  Administered 2017-01-08: 50 ug via INTRAVENOUS

## 2017-01-08 MED ORDER — ACETAMINOPHEN 650 MG RE SUPP: 650.0000 mg | RECTAL | Status: DC | PRN

## 2017-01-08 MED ORDER — NICARDIPINE HCL IN NACL 20-0.86 MG/200ML-% IV SOLN
3.0000 mg/h | INTRAVENOUS | Status: DC
  Administered 2017-01-08: 10 mg/h via INTRAVENOUS
  Administered 2017-01-08: 5 mg/h via INTRAVENOUS
  Administered 2017-01-08: 10 mg/h via INTRAVENOUS
  Administered 2017-01-08: 3 mg/h via INTRAVENOUS
  Administered 2017-01-09: 7.5 mg/h via INTRAVENOUS
  Administered 2017-01-09: 3 mg/h via INTRAVENOUS
  Administered 2017-01-09 (×2): 5 mg/h via INTRAVENOUS
  Administered 2017-01-10 (×2): 15 mg/h via INTRAVENOUS
  Administered 2017-01-10: 5 mg/h via INTRAVENOUS
  Administered 2017-01-10: 10 mg/h via INTRAVENOUS
  Administered 2017-01-10: 5 mg/h via INTRAVENOUS
  Filled 2017-01-08 (×12): qty 200

## 2017-01-08 MED ORDER — LIDOCAINE HCL (CARDIAC) 20 MG/ML IV SOLN
INTRAVENOUS | Status: DC | PRN
  Administered 2017-01-08: 60 mg via INTRAVENOUS

## 2017-01-08 MED ORDER — SODIUM CHLORIDE 0.9 % IV SOLN
1000.0000 mg | INTRAVENOUS | Status: AC
  Administered 2017-01-08: 1000 mg via INTRAVENOUS
  Filled 2017-01-08: qty 20

## 2017-01-08 MED ORDER — ROCURONIUM BROMIDE 100 MG/10ML IV SOLN
INTRAVENOUS | Status: DC | PRN
  Administered 2017-01-08: 20 mg via INTRAVENOUS
  Administered 2017-01-08: 50 mg via INTRAVENOUS

## 2017-01-08 MED ORDER — ROCURONIUM BROMIDE 10 MG/ML (PF) SYRINGE
PREFILLED_SYRINGE | INTRAVENOUS | Status: AC
  Filled 2017-01-08: qty 5

## 2017-01-08 MED ORDER — DEXAMETHASONE SODIUM PHOSPHATE 10 MG/ML IJ SOLN
INTRAMUSCULAR | Status: AC
  Filled 2017-01-08: qty 1

## 2017-01-08 MED ORDER — SODIUM CHLORIDE 0.9 % IV BOLUS (SEPSIS): 1000.0000 mL | Freq: Once | INTRAVENOUS | Status: AC

## 2017-01-08 MED ORDER — LIDOCAINE-EPINEPHRINE 1 %-1:100000 IJ SOLN
INTRAMUSCULAR | Status: DC | PRN
  Administered 2017-01-08: 10 mL

## 2017-01-08 MED ORDER — LABETALOL HCL 5 MG/ML IV SOLN
INTRAVENOUS | Status: DC | PRN
  Administered 2017-01-08: 5 mg via INTRAVENOUS

## 2017-01-08 MED ORDER — FENTANYL CITRATE (PF) 100 MCG/2ML IJ SOLN: 25.0000 ug | INTRAMUSCULAR | Status: DC | PRN

## 2017-01-08 MED ORDER — DEXAMETHASONE SODIUM PHOSPHATE 10 MG/ML IJ SOLN
INTRAMUSCULAR | Status: DC | PRN
  Administered 2017-01-08: 10 mg via INTRAVENOUS

## 2017-01-08 MED ORDER — ONDANSETRON HCL 4 MG/2ML IJ SOLN
INTRAMUSCULAR | Status: DC | PRN
  Administered 2017-01-08: 4 mg via INTRAVENOUS

## 2017-01-08 MED ORDER — ESMOLOL HCL 100 MG/10ML IV SOLN
INTRAVENOUS | Status: AC
  Filled 2017-01-08: qty 10

## 2017-01-08 MED ORDER — CHLORHEXIDINE GLUCONATE 0.12% ORAL RINSE (MEDLINE KIT)
15.0000 mL | Freq: Two times a day (BID) | OROMUCOSAL | Status: DC
  Administered 2017-01-08 – 2017-01-10 (×6): 15 mL via OROMUCOSAL

## 2017-01-08 MED ORDER — HYDROCODONE-ACETAMINOPHEN 5-325 MG PO TABS: 1.0000 | ORAL_TABLET | ORAL | Status: DC | PRN

## 2017-01-08 MED ORDER — FENTANYL CITRATE (PF) 100 MCG/2ML IJ SOLN
100.0000 ug | Freq: Once | INTRAMUSCULAR | Status: AC
  Administered 2017-01-08: 100 ug via INTRAVENOUS

## 2017-01-08 MED ORDER — SODIUM CHLORIDE 0.9 % IV SOLN
500.0000 mg | Freq: Two times a day (BID) | INTRAVENOUS | Status: DC
  Administered 2017-01-08 – 2017-01-10 (×5): 500 mg via INTRAVENOUS
  Filled 2017-01-08 (×7): qty 5

## 2017-01-08 MED ORDER — MIDAZOLAM HCL 2 MG/2ML IJ SOLN
2.0000 mg | Freq: Once | INTRAMUSCULAR | Status: AC
  Administered 2017-01-08: 2 mg via INTRAVENOUS

## 2017-01-08 MED ORDER — SODIUM CHLORIDE 0.9 % IR SOLN
Status: DC | PRN
  Administered 2017-01-08: 08:00:00

## 2017-01-08 MED ORDER — SODIUM CHLORIDE 0.9 % IV SOLN
Freq: Once | INTRAVENOUS | Status: AC
  Administered 2017-01-08: 13:00:00 via INTRAVENOUS

## 2017-01-08 MED ORDER — SODIUM CHLORIDE 0.9 % IV SOLN: Freq: Once | INTRAVENOUS | Status: DC

## 2017-01-08 MED ORDER — REMIFENTANIL HCL 1 MG IV SOLR
0.0125 ug/kg/min | INTRAVENOUS | Status: AC
  Administered 2017-01-08: .2 ug/kg/min via INTRAVENOUS
  Filled 2017-01-08: qty 2000

## 2017-01-08 MED ORDER — LABETALOL HCL 5 MG/ML IV SOLN
INTRAVENOUS | Status: AC
  Filled 2017-01-08: qty 4

## 2017-01-08 MED ORDER — LORAZEPAM 2 MG/ML IJ SOLN
1.0000 mg | INTRAMUSCULAR | Status: DC | PRN
  Administered 2017-01-08 – 2017-01-10 (×2): 2 mg via INTRAVENOUS
  Filled 2017-01-08 (×2): qty 1

## 2017-01-08 MED ORDER — VITAL HIGH PROTEIN PO LIQD
1000.0000 mL | ORAL | Status: DC
  Administered 2017-01-08: 1000 mL

## 2017-01-08 MED ORDER — LABETALOL HCL 5 MG/ML IV SOLN
10.0000 mg | INTRAVENOUS | Status: DC | PRN
  Administered 2017-01-08: 40 mg via INTRAVENOUS
  Administered 2017-01-08: 20 mg via INTRAVENOUS
  Filled 2017-01-08: qty 4
  Filled 2017-01-08: qty 8

## 2017-01-08 MED ORDER — FENTANYL CITRATE (PF) 250 MCG/5ML IJ SOLN
INTRAMUSCULAR | Status: AC
  Filled 2017-01-08: qty 5

## 2017-01-08 MED ORDER — PROPOFOL 10 MG/ML IV BOLUS
INTRAVENOUS | Status: AC
  Filled 2017-01-08: qty 20

## 2017-01-08 MED ORDER — NICARDIPINE HCL IN NACL 20-0.86 MG/200ML-% IV SOLN
INTRAVENOUS | Status: AC
  Filled 2017-01-08: qty 200

## 2017-01-08 MED ORDER — PHENYLEPHRINE HCL 10 MG/ML IJ SOLN
INTRAVENOUS | Status: DC | PRN
  Administered 2017-01-08: 25 ug/min via INTRAVENOUS

## 2017-01-08 MED ORDER — ETOMIDATE 2 MG/ML IV SOLN
20.0000 mg | Freq: Once | INTRAVENOUS | Status: AC
  Administered 2017-01-08: 20 mg via INTRAVENOUS

## 2017-01-08 MED ORDER — MIDAZOLAM HCL 2 MG/2ML IJ SOLN
1.0000 mg | INTRAMUSCULAR | Status: DC | PRN
  Administered 2017-01-10: 2 mg via INTRAVENOUS
  Filled 2017-01-08 (×2): qty 2

## 2017-01-08 MED FILL — Medication: Qty: 1 | Status: AC

## 2017-01-08 SURGICAL SUPPLY — 67 items
BAG DECANTER FOR FLEXI CONT (MISCELLANEOUS) ×3 IMPLANT
BANDAGE GAUZE 4  KLING STR (GAUZE/BANDAGES/DRESSINGS) IMPLANT
BIT DRILL WIRE PASS 1.3MM (BIT) IMPLANT
BNDG COHESIVE 4X5 TAN NS LF (GAUZE/BANDAGES/DRESSINGS) IMPLANT
BUR ACORN 6.0 PRECISION (BURR) ×2 IMPLANT
BUR ACORN 6.0MM PRECISION (BURR) ×1
BUR SPIRAL ROUTER 2.3 (BUR) ×2 IMPLANT
BUR SPIRAL ROUTER 2.3MM (BUR) ×1
CANISTER SUCT 3000ML PPV (MISCELLANEOUS) ×6 IMPLANT
CARTRIDGE OIL MAESTRO DRILL (MISCELLANEOUS) ×1 IMPLANT
CLIP VESOCCLUDE MED 6/CT (CLIP) IMPLANT
COVER BACK TABLE 60X90IN (DRAPES) IMPLANT
DERMABOND ADVANCED (GAUZE/BANDAGES/DRESSINGS)
DERMABOND ADVANCED .7 DNX12 (GAUZE/BANDAGES/DRESSINGS) IMPLANT
DIFFUSER DRILL AIR PNEUMATIC (MISCELLANEOUS) ×3 IMPLANT
DRAIN HEMOVAC 1/8 X 5 (WOUND CARE) ×3 IMPLANT
DRAPE MICROSCOPE LEICA (MISCELLANEOUS) IMPLANT
DRAPE NEUROLOGICAL W/INCISE (DRAPES) ×3 IMPLANT
DRAPE SURG 17X23 STRL (DRAPES) IMPLANT
DRAPE WARM FLUID 44X44 (DRAPE) ×3 IMPLANT
DRILL WIRE PASS 1.3MM (BIT)
ELECT REM PT RETURN 9FT ADLT (ELECTROSURGICAL) ×3
ELECTRODE REM PT RTRN 9FT ADLT (ELECTROSURGICAL) ×1 IMPLANT
EVACUATOR SILICONE 100CC (DRAIN) ×3 IMPLANT
GAUZE SPONGE 4X4 12PLY STRL (GAUZE/BANDAGES/DRESSINGS) ×3 IMPLANT
GAUZE SPONGE 4X4 12PLY STRL LF (GAUZE/BANDAGES/DRESSINGS) ×3 IMPLANT
GAUZE SPONGE 4X4 16PLY XRAY LF (GAUZE/BANDAGES/DRESSINGS) IMPLANT
GLOVE BIOGEL PI IND STRL 7.0 (GLOVE) ×1 IMPLANT
GLOVE BIOGEL PI IND STRL 7.5 (GLOVE) ×1 IMPLANT
GLOVE BIOGEL PI INDICATOR 7.0 (GLOVE) ×2
GLOVE BIOGEL PI INDICATOR 7.5 (GLOVE) ×2
GLOVE ECLIPSE 9.0 STRL (GLOVE) ×6 IMPLANT
GOWN STRL REUS W/ TWL LRG LVL3 (GOWN DISPOSABLE) ×2 IMPLANT
GOWN STRL REUS W/ TWL XL LVL3 (GOWN DISPOSABLE) ×2 IMPLANT
GOWN STRL REUS W/TWL 2XL LVL3 (GOWN DISPOSABLE) IMPLANT
GOWN STRL REUS W/TWL LRG LVL3 (GOWN DISPOSABLE) ×4
GOWN STRL REUS W/TWL XL LVL3 (GOWN DISPOSABLE) ×4
HEMOSTAT SURGICEL 2X14 (HEMOSTASIS) IMPLANT
KIT BASIN OR (CUSTOM PROCEDURE TRAY) ×3 IMPLANT
KIT ROOM TURNOVER OR (KITS) ×3 IMPLANT
NEEDLE HYPO 25X1 1.5 SAFETY (NEEDLE) ×3 IMPLANT
NS IRRIG 1000ML POUR BTL (IV SOLUTION) ×9 IMPLANT
OIL CARTRIDGE MAESTRO DRILL (MISCELLANEOUS) ×3
PACK CRANIOTOMY (CUSTOM PROCEDURE TRAY) ×3 IMPLANT
PAD ARMBOARD 7.5X6 YLW CONV (MISCELLANEOUS) ×3 IMPLANT
PATTIES SURGICAL .25X.25 (GAUZE/BANDAGES/DRESSINGS) IMPLANT
PATTIES SURGICAL .5 X.5 (GAUZE/BANDAGES/DRESSINGS) IMPLANT
PATTIES SURGICAL .5 X3 (DISPOSABLE) IMPLANT
PATTIES SURGICAL 1X1 (DISPOSABLE) IMPLANT
PLATE 1.5  2HOLE LNG NEURO (Plate) ×6 IMPLANT
PLATE 1.5 2HOLE LNG NEURO (Plate) ×3 IMPLANT
SCREW SELF DRILL HT 1.5/4MM (Screw) ×18 IMPLANT
SPECIMEN JAR SMALL (MISCELLANEOUS) IMPLANT
SPONGE LAP 18X18 X RAY DECT (DISPOSABLE) IMPLANT
SPONGE NEURO XRAY DETECT 1X3 (DISPOSABLE) IMPLANT
SPONGE SURGIFOAM ABS GEL 100 (HEMOSTASIS) ×3 IMPLANT
STAPLER VISISTAT 35W (STAPLE) ×3 IMPLANT
STOCKINETTE TUBULAR 6 INCH (GAUZE/BANDAGES/DRESSINGS) ×3 IMPLANT
SUT NURALON 4 0 TR CR/8 (SUTURE) ×6 IMPLANT
SUT VIC AB 2-0 CT2 18 VCP726D (SUTURE) ×3 IMPLANT
SYR CONTROL 10ML LL (SYRINGE) ×3 IMPLANT
TAPE CLOTH SURG 4X10 WHT LF (GAUZE/BANDAGES/DRESSINGS) ×3 IMPLANT
TOWEL GREEN STERILE (TOWEL DISPOSABLE) ×3 IMPLANT
TOWEL GREEN STERILE FF (TOWEL DISPOSABLE) ×3 IMPLANT
TRAY FOLEY W/METER SILVER 16FR (SET/KITS/TRAYS/PACK) ×3 IMPLANT
UNDERPAD 30X30 (UNDERPADS AND DIAPERS) IMPLANT
WATER STERILE IRR 1000ML POUR (IV SOLUTION) ×3 IMPLANT

## 2017-01-08 NOTE — Procedures (Signed)
Intubation Procedure Note Lam Mccubbins [id number] Sep 02, 1927  Procedure: Intubation Indications: Airway protection and maintenance  Procedure Details Consent: Unable to obtain consent because of emergent medical necessity. Time Out: Verified patient identification, verified procedure, site/side was marked, verified correct patient position, special equipment/implants available, medications/allergies/relevent history reviewed, required imaging and test results available.  Performed  Maximum sterile technique was used including gloves, hand hygiene and mask.  MAC    Evaluation Hemodynamic Status: BP stable throughout; O2 sats: stable throughout Patient's Current Condition: stable Complications: No apparent complications Patient did tolerate procedure well. Chest X-ray ordered to verify placement.  CXR: pending.   Jennet Maduro 12/24/2016

## 2017-01-08 NOTE — Procedures (Signed)
Pt transported to CT on vent, no complications  

## 2017-01-08 NOTE — ED Provider Notes (Signed)
[facility]-EMERGENCY DEPT Provider Note   CSN: [id number] Arrival date & time: 12/24/2016  0325     History   Chief Complaint Chief Complaint  Patient presents with  . Fall    HPI Bryan Cherry is a 83 y.o. male.  Patient presents to the ED with a chief complaint of fall.  He has been staying at the [facility] [place]  He states that he stood up tonight, lost his balance, and fell striking his head on the night stand.  He complains of head pain and neck pain.  He denies passing out.  He denies any new numbness, weakness, tingling, slurred speech, or vision changes.  He denies any other associated symptoms.  Bryan Cherry, [place] [phone] Caregiver- Bryan Cherry Rm 897   The history is provided by the patient. No language interpreter was used.    Past Medical History:  Diagnosis Date  . Hypertension     There are no active problems to display for this patient.   History reviewed. No pertinent surgical history.     Home Medications    Prior to Admission medications   Not on File    Family History No family history on file.  Social History Social History  Substance Use Topics  . Smoking status: Never Smoker  . Smokeless tobacco: Never Used  . Alcohol use Not on file     Allergies   Patient has no allergy information on record.   Review of Systems Review of Systems  All other systems reviewed and are negative.    Physical Exam Updated Vital Signs BP (!) 180/82 (BP Location: Right Arm)   Pulse 95   Temp (!) 97.5 F (36.4 C) (Oral)   Resp 16   SpO2 99%   Physical Exam  Constitutional: He is oriented to person, place, and time. He appears well-developed and well-nourished.  HENT:  Head: Normocephalic and atraumatic.  No evidence of traumatic head injury  Eyes: Pupils are equal, round, and reactive to light. Conjunctivae and EOM are normal. Right eye exhibits no discharge. Left  eye exhibits no discharge. No scleral icterus.  Neck: Normal range of motion. Neck supple. No JVD present.  Cardiovascular: Normal rate, regular rhythm and normal heart sounds.  Exam reveals no gallop and no friction rub.   No murmur heard. Pulmonary/Chest: Effort normal and breath sounds normal. No respiratory distress. He has no wheezes. He has no rales. He exhibits no tenderness.  Abdominal: Soft. He exhibits no distension and no mass. There is no tenderness. There is no rebound and no guarding.  Musculoskeletal: Normal range of motion. He exhibits no edema or tenderness.  Moves all extremities  Neurological: He is alert and oriented to person, place, and time.  Sensation intact throughout   Skin: Skin is warm and dry.  Psychiatric: He has a normal mood and affect. His behavior is normal. Judgment and thought content normal.  Nursing note and vitals reviewed.    ED Treatments / Results  Labs (all labs ordered are listed, but only abnormal results are displayed) Labs Reviewed - No data to display  EKG  EKG Interpretation None       Radiology No results found.  Procedures Procedures (including critical care time) CRITICAL CARE Performed by: Roxy Horseman   Total critical care time: 39 minutes  Critical care time was exclusive of separately billable procedures and treating other patients.  Critical care was necessary  to treat or prevent imminent or life-threatening deterioration.  Critical care was time spent personally by me on the following activities: development of treatment plan with patient and/or surrogate as well as nursing, discussions with consultants, evaluation of patient's response to treatment, examination of patient, obtaining history from patient or surrogate, ordering and performing treatments and interventions, ordering and review of laboratory studies, ordering and review of radiographic studies, pulse oximetry and re-evaluation of patient's  condition.  Medications Ordered in ED Medications - No data to display   Initial Impression / Assessment and Plan / ED Course  I have reviewed the triage vital signs and the nursing notes.  Pertinent labs & imaging results that were available during my care of the patient were reviewed by me and considered in my medical decision making (see chart for details).     Patient with mechanical fall.  Hit head.  On plavix.  Will check CT head and neck.  No evidence of traumatic injuries.  CT with subdural hematoma and midline shift.    Spoke with Dr. Jordan Likes from neurosurgery, who will come to evaluate the patient.  I have attempted to reach family members, with no success.  Patient discussed with Dr. Jordan Likes, who states that the patient will need to go to the OR for evacuation.  Notified Dr. Jordan Likes of mildly elevated troponin of 0.05, who acknowledged these results and states nothing else to do now, but needs to go to surgery to evacuate the bleed.     Bryan Cherry, [place] [phone] Caregiver- Bryan [phone]  Final Clinical Impressions(s) / ED Diagnoses   Final diagnoses:  Fall, initial encounter  Subdural hematoma [facility])    New Prescriptions New Prescriptions   No medications on file       Roxy Horseman, Cordelia Poche 2017-01-27 0559    Shon Baton, MD 01-27-17 [phone]

## 2017-01-08 NOTE — Brief Op Note (Signed)
01/19/2017  8:20 AM  PATIENT:  Bryan Cherry  83 y.o. male  PRE-OPERATIVE DIAGNOSIS:  right side subdural hematoma  POST-OPERATIVE DIAGNOSIS:  right side subdural hematoma  PROCEDURE:  Procedure(s): CRANIOTOMY HEMATOMA EVACUATION SUBDURAL (Right)  SURGEON:  Surgeon(s) and Role:    Julio Sicks, MD - Primary  PHYSICIAN ASSISTANT:   ASSISTANTS:    ANESTHESIA:   general  EBL:  Total I/O In: -  Out: 500 [Urine:300; Blood:200]  BLOOD ADMINISTERED:one unit PLTS  DRAINS: (10 mm) Blake drain(s) in the subdural space   LOCAL MEDICATIONS USED:  LIDOCAINE   SPECIMEN:  na  DISPOSITION OF SPECIMEN:  N/A  COUNTS:  YES  TOURNIQUET:  * No tourniquets in log *  DICTATION: .Dragon Dictation  PLAN OF CARE: Admit to inpatient   PATIENT DISPOSITION:  PACU - hemodynamically stable.   Delay start of Pharmacological VTE agent (>24hrs) due to surgical blood loss or risk of bleeding: yes

## 2017-01-08 NOTE — Anesthesia Procedure Notes (Signed)
Arterial Line Insertion Start/EndOctober 04, 2018 7:05 AM, January 23, 2017 7:10 AM Performed by: Delma Freeze, CRNA  Patient location: Pre-op. Preanesthetic checklist: patient identified, IV checked, site marked, risks and benefits discussed, surgical consent, monitors and equipment checked, pre-op evaluation, timeout performed and anesthesia consent Lidocaine 1% used for infiltration Left, radial was placed Catheter size: 20 G Hand hygiene performed  and maximum sterile barriers used   Attempts: 1 Procedure performed without using ultrasound guided technique. Following insertion, dressing applied and Biopatch. Post procedure assessment: normal  Patient tolerated the procedure well with no immediate complications.

## 2017-01-08 NOTE — Transfer of Care (Signed)
Immediate Anesthesia Transfer of Care Note  Patient: Bryan Cherry  Procedure(s) Performed: Procedure(s): CRANIOTOMY HEMATOMA EVACUATION SUBDURAL (Right)  Patient Location: PACU  Anesthesia Type:General  Level of Consciousness: responds to stimulation  Airway & Oxygen Therapy: Patient Spontanous Breathing and Patient connected to nasal cannula oxygen  Post-op Assessment: Report given to RN and Post -op Vital signs reviewed and stable  Post vital signs: Reviewed and stable  Last Vitals:  Vitals:   12/31/2016 0530 01/07/2017 0600  BP: (!) 186/79 (!) 191/87  Pulse: 88 92  Resp: (!) 25 (!) 27  Temp:    SpO2: 94% 97%    Last Pain:  Vitals:   01/05/2017 0326  TempSrc: Oral         Complications: No apparent anesthesia complications

## 2017-01-08 NOTE — Progress Notes (Signed)
Brief Nutrition Note  Consult received for enteral/tube feeding initiation and management.  Adult Enteral Nutrition Protocol initiated. Full assessment to follow.  Admitting Dx: Subdural hematoma (HCC) [I62.00] Fall, initial encounter [W19.XXXA]   Labs:   Recent Labs Lab Jan 29, 2017 0443 01/29/17 1032  NA 135 136  K 3.1* 3.2*  CL 102 106  CO2 22 19*  BUN 13 13  CREATININE 1.23 1.29*  CALCIUM 8.3* 7.2*  MG  --  1.5*  PHOS  --  4.4  GLUCOSE 201* 280*    [address], LDN, CNSC [phone] Pager [phone] After Hours Pager

## 2017-01-08 NOTE — Anesthesia Procedure Notes (Signed)
Procedure Name: Intubation Date/Time: 01/09/2017 7:26 AM Performed by: Candis Shine Pre-anesthesia Checklist: Patient identified, Emergency Drugs available, Suction available and Patient being monitored Patient Re-evaluated:Patient Re-evaluated prior to induction Oxygen Delivery Method: Circle System Utilized Preoxygenation: Pre-oxygenation with 100% oxygen Induction Type: IV induction Ventilation: Mask ventilation without difficulty and Oral airway inserted - appropriate to patient size Laryngoscope Size: Mac and 3 Grade View: Grade I Tube type: Subglottic suction tube Tube size: 7.5 mm Number of attempts: 1 Airway Equipment and Method: Stylet and Oral airway Placement Confirmation: ETT inserted through vocal cords under direct vision,  positive ETCO2 and breath sounds checked- equal and bilateral Secured at: 22 cm Tube secured with: Tape Dental Injury: Teeth and Oropharynx as per pre-operative assessment

## 2017-01-08 NOTE — Progress Notes (Signed)
Consent not completed due to emergent nature of surgery, assessment was also not completed.

## 2017-01-08 NOTE — Progress Notes (Signed)
CRITICAL VALUE ALERT  Critical Value:  hgb 6.7  Date & Time Notied:  2017-02-03  Provider Notified: Dr. Molli Knock  Orders Received/Actions taken: Transfusion ordered

## 2017-01-08 NOTE — Progress Notes (Signed)
eLink Physician-Brief Progress Note Patient Name: Bryan Cherry DOB: November 30, 1927 MRN: [id number]   Date of Service  12/25/2016  HPI/Events of Note  Agitation - request for R soft wrist restraint.   eICU Interventions  Will order R soft wrist restraint.         [name] 12/29/2016, 4:08 PM

## 2017-01-08 NOTE — Progress Notes (Signed)
eLink Physician-Brief Progress Note Patient Name: Bryan Cherry DOB: Dec 08, 1927 MRN: [id number]   Date of Service  01-10-17  HPI/Events of Note  Increasing FSBS  eICU Interventions  Start insulin infusion order set     Intervention Category Evaluation Type: Other  Erin Fulling 01/10/17, 11:44 PM
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
## 2017-01-08 NOTE — Anesthesia Preprocedure Evaluation (Addendum)
Anesthesia Evaluation  Patient identified by MRN, date of birth, ID band Patient confused    Reviewed: Allergy & Precautions, NPO status , Patient's Chart, lab work & pertinent test results, Unable to perform ROS - Chart review onlyPreop documentation limited or incomplete due to emergent nature of procedure.  Airway Mallampati: III  TM Distance: >3 FB Neck ROM: Full    Dental  (+) Dental Advisory Given, Edentulous Upper, Partial Lower   Pulmonary COPD, former smoker,    Pulmonary exam normal breath sounds clear to auscultation       Cardiovascular hypertension, + Cardiac Stents and + CABG  Normal cardiovascular exam+ dysrhythmias + pacemaker + Valvular Problems/Murmurs AS, AI and MR  Rhythm:Regular Rate:Normal + Systolic murmurs Hx SSS w/ dual chamber St Jude pacemaker  TTE 01/2016 -  Mild concentric LVH. EF of >55%. The left atrium is mildly dilated. The right ventricle is normal in size and function. Pacemaker wire seen in the right ventricular cavity. There is mild AI and mild AS with peak pressure gradient of 14.20mmHg and mean pressure gradient 8.57mmHg.AVA by continuity equation is 1.3cm. Mild MR is present.Mild-to-moderate TR present.There is moderate pulmonary hypertension.The RV systolic pressure, as measured by Doppler, is 51.22mmHg.  Carotid US 2018 - 0-50% bilateral ICAS  Abd [place] 2018 - 3.2 x 3.3cm infrarenal AAA   Neuro/Psych Insomnia, SDH s/p fall negative psych ROS   GI/Hepatic Neg liver ROS, Crohn's disease   Endo/Other  negative endocrine ROS  Renal/GU negative Renal ROS  negative genitourinary   Musculoskeletal negative musculoskeletal ROS (+)   Abdominal   Peds  Hematology  (+) anemia ,   Anesthesia Other Findings   Reproductive/Obstetrics                          Anesthesia Physical Anesthesia Plan  ASA: III and emergent  Anesthesia Plan: General   Post-op Pain  Management:    Induction: Intravenous  PONV Risk Score and Plan: 3 and Ondansetron, Dexamethasone, Propofol infusion and Treatment may vary due to age or medical condition  Airway Management Planned: Oral ETT  Additional Equipment: Arterial line  Intra-op Plan:   Post-operative Plan: Possible Post-op intubation/ventilation  Informed Consent: I have reviewed the patients History and Physical, chart, labs and discussed the procedure including the risks, benefits and alternatives for the proposed anesthesia with the patient or authorized representative who has indicated his/her understanding and acceptance.   Dental advisory given  Plan Discussed with: CRNA  Anesthesia Plan Comments:        Anesthesia Quick Evaluation

## 2017-01-08 NOTE — Op Note (Signed)
Date of procedure: 01/08/2018  Date of dictation: Same  Service: Neurosurgery  Preoperative diagnosis: Acute right convexity subdural hematoma, posttraumatic  Postoperative diagnosis: Same  Procedure Name: Right frontal temporal craniotomy with evacuation of subdural hematoma, placement of subdural drain  Surgeon:Jacoby Zanni A.Deirdra Heumann, M.D.  Asst. Surgeon: None  Anesthesia: General  Indication: 83 year old male status post fall with progressive headache and declining level of consciousness. Workup demonstrates evidence of a large right-sided convexity acute subdural hematoma with mass effect. Patient wishes to proceed with surgery to hopefully improve his situation.  Operative note: After induction of anesthesia, patient positioned supine with neck turned towards the left. Patient's right scalp prepped and draped sterilely. Curvilinear incision made. Scalp flap and temporalis muscle reflected anteriorly. Right-sided frontotemporal craniotomy performed using high-speed drill. Dura elevated and incised. Subdural hematoma identified and evacuated. Large amounts of densely clotted subdural blood was then resected using gentle suction and traction. A large amount of interhemispheric hematoma was also resected. There were no active points of bleeding identified. The wound was copiously irrigated. A medium 10 mm plate drain was left in the subdural space and exited through separate stab incision. Dura was loosely reapproximated. Gelfoam was placed over the craniotomy defect. Bone flap was reapproximated using OsteoMed plates. Scalp reapproximated using Vicryl sutures. Staples were applied at the surface. No apparent complications. Patient tolerated the procedure well and he returns to the recovery room postop.

## 2017-01-08 NOTE — Anesthesia Postprocedure Evaluation (Signed)
Anesthesia Post Note  Patient: Bryan Cherry  Procedure(s) Performed: Procedure(s) (LRB): CRANIOTOMY HEMATOMA EVACUATION SUBDURAL (Right)     Patient location during evaluation: PACU Anesthesia Type: General Level of consciousness: awake and alert and confused Pain management: pain level controlled Vital Signs Assessment: post-procedure vital signs reviewed and stable Respiratory status: spontaneous breathing, nonlabored ventilation, respiratory function stable and patient connected to nasal cannula oxygen Cardiovascular status: blood pressure returned to baseline and stable Postop Assessment: no apparent nausea or vomiting Anesthetic complications: no    Last Vitals:  Vitals:   2017-01-21 0954 2017-01-21 1000  BP: (!) 133/41 (!) 96/44  Pulse: 84 76  Resp: 15 13  Temp:    SpO2: 100% 100%    Last Pain:  Vitals:   Jan 21, 2017 0326  TempSrc: Oral                 Kennieth Rad

## 2017-01-08 NOTE — H&P (Signed)
Bryan Cherry is an 83 y.o. male.   Chief Complaint: headache HPI: 83 year old male status post fall earlier this evening. Patient with progressive headache. Patient chronically on Plavix. Other medical history somewhat unknown. Patient denies seizure. Denies weakness.  Past Medical History:  Diagnosis Date  . Hypertension     History reviewed. No pertinent surgical history.  No family history on file. Social History:  reports that he has never smoked. He has never used smokeless tobacco. His alcohol and drug histories are not on file.  Allergies: Not on File   (Not in a hospital admission)  Results for orders placed or performed during the hospital encounter of 12/31/2016 (from the past 48 hour(s))  CBC with Differential/Platelet     Status: Abnormal   Collection Time: 01/14/2017  4:43 AM  Result Value Ref Range   WBC 8.6 4.0 - 10.5 K/uL    Comment: WHITE COUNT CONFIRMED ON SMEAR   RBC 4.65 4.22 - 5.81 MIL/uL   Hemoglobin 9.5 (L) 13.0 - 17.0 g/dL   HCT 30.2 (L) 39.0 - 52.0 %   MCV 64.9 (L) 78.0 - 100.0 fL   MCH 20.4 (L) 26.0 - 34.0 pg   MCHC 31.5 30.0 - 36.0 g/dL   RDW 15.8 (H) 11.5 - 15.5 %   Platelets 96 (L) 150 - 400 K/uL    Comment: PLATELET COUNT CONFIRMED BY SMEAR   Neutrophils Relative % 82 %   Lymphocytes Relative 10 %   Monocytes Relative 7 %   Eosinophils Relative 1 %   Basophils Relative 0 %   Neutro Abs 7.0 1.7 - 7.7 K/uL   Lymphs Abs 0.9 0.7 - 4.0 K/uL   Monocytes Absolute 0.6 0.1 - 1.0 K/uL   Eosinophils Absolute 0.1 0.0 - 0.7 K/uL   Basophils Absolute 0.0 0.0 - 0.1 K/uL   RBC Morphology BASOPHILIC STIPPLING     Comment: ELLIPTOCYTES RARE NRBCs POLYCHROMASIA PRESENT SPHEROCYTES   Basic metabolic panel     Status: Abnormal   Collection Time: 12/29/2016  4:43 AM  Result Value Ref Range   Sodium 135 135 - 145 mmol/L   Potassium 3.1 (L) 3.5 - 5.1 mmol/L   Chloride 102 101 - 111 mmol/L   CO2 22 22 - 32 mmol/L   Glucose, Bld 201 (H) 65 - 99 mg/dL   BUN  13 6 - 20 mg/dL   Creatinine, Ser 1.23 0.61 - 1.24 mg/dL   Calcium 8.3 (L) 8.9 - 10.3 mg/dL   GFR calc non Af Amer 51 (L) >60 mL/min   GFR calc Af Amer 59 (L) >60 mL/min    Comment: (NOTE) The eGFR has been calculated using the CKD EPI equation. This calculation has not been validated in all clinical situations. eGFR's persistently <60 mL/min signify possible Chronic Kidney Disease.    Anion gap 11 5 - 15  Troponin I     Status: Abnormal   Collection Time: 01/12/2017  4:43 AM  Result Value Ref Range   Troponin I 0.05 (HH) <0.03 ng/mL    Comment: CRITICAL RESULT CALLED TO, READ BACK BY AND VERIFIED WITH: NUCKLES C,RN 12/21/2016 0544 WAYK    Ct Head Wo Contrast  Result Date: 01/09/2017 CLINICAL DATA:  Fall EXAM: CT HEAD WITHOUT CONTRAST CT CERVICAL SPINE WITHOUT CONTRAST TECHNIQUE: Multidetector CT imaging of the head and cervical spine was performed following the standard protocol without intravenous contrast. Multiplanar CT image reconstructions of the cervical spine were also generated. COMPARISON:  None. FINDINGS: CT HEAD FINDINGS  Brain: There is a large right hemisphere subdural hematoma. There are components along the periphery of the convexity and along the right aspect of the cerebral falx. The peripheral component measures 1.5 cm in thickness. The parafalcine component measures 1.1 cm in thickness. There is 6 mm of leftward midline shift at the level of the foramina of [place]. There is mass effect on the right lateral ventricle. The basal cisterns are patent. No hydrocephalus. No intraparenchymal hematoma. Vascular: Atherosclerotic calcification of the vertebral and internal carotid arteries at the skull base. Skull: Normal visualized skull base, calvarium and extracranial soft tissues. Sinuses/Orbits: No sinus fluid levels or advanced mucosal thickening. No mastoid effusion. Normal orbits. CT CERVICAL SPINE FINDINGS Alignment: No static subluxation. Facets are aligned. Occipital condyles are  normally positioned. Skull base and vertebrae: No acute fracture. Soft tissues and spinal canal: No prevertebral fluid or swelling. No visible canal hematoma. Disc levels: Multilevel severe disc space loss and facet hypertrophy resulting in severe foraminal stenosis at left C5-C6 and at least moderate foraminal stenosis bilaterally at C4-C5. Upper chest: Biapical emphysema. Other: Normal visualized paraspinal cervical soft tissues. IMPRESSION: 1. Large right hemispheric subdural hematoma with components along the right convexity and along the falx cerebri. 6 millimeters of leftward midline shift. 2. No skull fracture. 3. No acute fracture or static subluxation of the cervical spine. 4. Multilevel cervical degenerative disc disease and facet arthrosis with moderate to severe C5-6 and C4-5 neural foraminal stenosis. Critical Value/emergent results were called by telephone at the time of interpretation on 01/09/2017 at 4:21 am to Dr. Montine Circle , who verbally acknowledged these results. Electronically Signed   By: Ulyses Jarred M.D.   On: 01/06/2017 04:29   Ct Cervical Spine Wo Contrast  Result Date: 12/23/2016 CLINICAL DATA:  Fall EXAM: CT HEAD WITHOUT CONTRAST CT CERVICAL SPINE WITHOUT CONTRAST TECHNIQUE: Multidetector CT imaging of the head and cervical spine was performed following the standard protocol without intravenous contrast. Multiplanar CT image reconstructions of the cervical spine were also generated. COMPARISON:  None. FINDINGS: CT HEAD FINDINGS Brain: There is a large right hemisphere subdural hematoma. There are components along the periphery of the convexity and along the right aspect of the cerebral falx. The peripheral component measures 1.5 cm in thickness. The parafalcine component measures 1.1 cm in thickness. There is 6 mm of leftward midline shift at the level of the foramina of [place]. There is mass effect on the right lateral ventricle. The basal cisterns are patent. No hydrocephalus.  No intraparenchymal hematoma. Vascular: Atherosclerotic calcification of the vertebral and internal carotid arteries at the skull base. Skull: Normal visualized skull base, calvarium and extracranial soft tissues. Sinuses/Orbits: No sinus fluid levels or advanced mucosal thickening. No mastoid effusion. Normal orbits. CT CERVICAL SPINE FINDINGS Alignment: No static subluxation. Facets are aligned. Occipital condyles are normally positioned. Skull base and vertebrae: No acute fracture. Soft tissues and spinal canal: No prevertebral fluid or swelling. No visible canal hematoma. Disc levels: Multilevel severe disc space loss and facet hypertrophy resulting in severe foraminal stenosis at left C5-C6 and at least moderate foraminal stenosis bilaterally at C4-C5. Upper chest: Biapical emphysema. Other: Normal visualized paraspinal cervical soft tissues. IMPRESSION: 1. Large right hemispheric subdural hematoma with components along the right convexity and along the falx cerebri. 6 millimeters of leftward midline shift. 2. No skull fracture. 3. No acute fracture or static subluxation of the cervical spine. 4. Multilevel cervical degenerative disc disease and facet arthrosis with moderate to severe C5-6 and C4-5  neural foraminal stenosis. Critical Value/emergent results were called by telephone at the time of interpretation on 01/18/2017 at 4:21 am to Dr. Montine Circle , who verbally acknowledged these results. Electronically Signed   By: Ulyses Jarred M.D.   On: 01/03/2017 04:29    Review of systems not obtained due to patient factors.  Blood pressure (!) 186/79, pulse 88, temperature (!) 97.5 F (36.4 C), temperature source Oral, resp. rate (!) 25, SpO2 94 %.  Patient is somnolent but will awaken to voice. He is oriented times person place and time. His speech is slow but fluent. Pupils are 3 mm and reactive bilaterally. Gaze is conjugate. Extraocular movements are full. Facial movement 5/5 bilaterally. Motor of  extremities 5/5. Sensory examination nonfocal. Examination of head ears eyes and throat unremarkable .chest with previous sternotomy scar. Pacemaker in place. Abdomen soft. Extremities free from injury or deformity. Assessment/Plan Right hemispheric subdural hematoma with mass effect. I discussed situation with the patient. Other family member unavailable. Patient is aware that he has a large subdural hematoma. He would like to move forward with craniotomy and evacuation of subdural hematoma. I discussed the risks and benefits. He wishes to proceed.  Kaleeya Hancock A 12/22/2016, 5:58 AM

## 2017-01-08 NOTE — ED Notes (Signed)
Dr Pool at bedside 

## 2017-01-08 NOTE — Progress Notes (Signed)
Patient arrived on unit at 0940, patient was hooked up to monitor.  Patient opened right eye slightly to voice, then right eye twitching began at 0943. Seizure activity then quickly progressed to full body activity, lasting up to one minute.  Patient became blue, oxygen saturations and heart rate were unreadable. Neurosurgery notified, critical care MD on unit and emergently intubated.

## 2017-01-08 NOTE — Progress Notes (Signed)
eLink Physician-Brief Progress Note Patient Name: Bryan Cherry DOB: 1927/06/10 MRN: [id number]   Date of Service  01/06/2017  HPI/Events of Note  Oliguria - No cardiac echo. No CVL or CVP. CXR this AM - no active disease.  eICU Interventions  Will bolus with 0.9 NaCl 1 liter IV over 1 hour now.      Intervention Category Intermediate Interventions: Oliguria - evaluation and management  Sommer,Steven Eugene 01/06/2017, 7:44 PM

## 2017-01-08 NOTE — ED Notes (Signed)
IV attempted x 4 without success

## 2017-01-09 ENCOUNTER — Inpatient Hospital Stay (HOSPITAL_COMMUNITY): Payer: Medicare PPO

## 2017-01-09 ENCOUNTER — Encounter (HOSPITAL_COMMUNITY): Payer: Self-pay | Admitting: Neurosurgery

## 2017-01-09 DIAGNOSIS — I10 Essential (primary) hypertension: Secondary | ICD-10-CM

## 2017-01-09 DIAGNOSIS — I6200 Nontraumatic subdural hemorrhage, unspecified: Secondary | ICD-10-CM

## 2017-01-09 LAB — TYPE AND SCREEN
ABO/RH(D): B POS
Antibody Screen: NEGATIVE
UNIT DIVISION: 0
UNIT DIVISION: 0
UNIT DIVISION: 0
Unit division: 0

## 2017-01-09 LAB — BLOOD GAS, ARTERIAL
ACID-BASE DEFICIT: 3 mmol/L — AB (ref 0.0–2.0)
BICARBONATE: 20.5 mmol/L (ref 20.0–28.0)
Drawn by: 364961
FIO2: 40
MECHVT: 550 mL
O2 Saturation: 98.6 %
PATIENT TEMPERATURE: 99.7
PEEP/CPAP: 5 cmH2O
PH ART: 7.431 (ref 7.350–7.450)
PO2 ART: 123 mmHg — AB (ref 83.0–108.0)
RATE: 16 resp/min
pCO2 arterial: 31.6 mmHg — ABNORMAL LOW (ref 32.0–48.0)

## 2017-01-09 LAB — PREPARE PLATELET PHERESIS: UNIT DIVISION: 0

## 2017-01-09 LAB — GLUCOSE, CAPILLARY
GLUCOSE-CAPILLARY: 258 mg/dL — AB (ref 65–99)
GLUCOSE-CAPILLARY: 207 mg/dL — AB (ref 65–99)
GLUCOSE-CAPILLARY: 160 mg/dL — AB (ref 65–99)
GLUCOSE-CAPILLARY: 109 mg/dL — AB (ref 65–99)
GLUCOSE-CAPILLARY: 192 mg/dL — AB (ref 65–99)
Glucose-Capillary: 117 mg/dL — ABNORMAL HIGH (ref 65–99)
Glucose-Capillary: 153 mg/dL — ABNORMAL HIGH (ref 65–99)
Glucose-Capillary: 160 mg/dL — ABNORMAL HIGH (ref 65–99)
Glucose-Capillary: 164 mg/dL — ABNORMAL HIGH (ref 65–99)
Glucose-Capillary: 177 mg/dL — ABNORMAL HIGH (ref 65–99)
Glucose-Capillary: 264 mg/dL — ABNORMAL HIGH (ref 65–99)
Glucose-Capillary: 93 mg/dL (ref 65–99)

## 2017-01-09 LAB — BPAM RBC
BLOOD PRODUCT EXPIRATION DATE: 201809212359
BLOOD PRODUCT EXPIRATION DATE: 201809242359
Blood Product Expiration Date: 201809242359
Blood Product Expiration Date: 201809242359
ISSUE DATE / TIME: 201809191224
ISSUE DATE / TIME: 201809191441
ISSUE DATE / TIME: 201809191710
UNIT TYPE AND RH: 1700
Unit Type and Rh: 7300
Unit Type and Rh: 7300
Unit Type and Rh: 7300

## 2017-01-09 LAB — CBC
HCT: 30 % — ABNORMAL LOW (ref 39.0–52.0)
Hemoglobin: 10 g/dL — ABNORMAL LOW (ref 13.0–17.0)
MCH: 22.8 pg — AB (ref 26.0–34.0)
MCHC: 33.3 g/dL (ref 30.0–36.0)
MCV: 68.5 fL — AB (ref 78.0–100.0)
PLATELETS: 80 10*3/uL — AB (ref 150–400)
RBC: 4.38 MIL/uL (ref 4.22–5.81)
RDW: 17.8 % — ABNORMAL HIGH (ref 11.5–15.5)
WBC: 8 10*3/uL (ref 4.0–10.5)

## 2017-01-09 LAB — BASIC METABOLIC PANEL
Anion gap: 7 (ref 5–15)
BUN: 24 mg/dL — ABNORMAL HIGH (ref 6–20)
CALCIUM: 7.1 mg/dL — AB (ref 8.9–10.3)
CHLORIDE: 113 mmol/L — AB (ref 101–111)
CO2: 20 mmol/L — AB (ref 22–32)
CREATININE: 1.12 mg/dL (ref 0.61–1.24)
GFR calc Af Amer: >60 mL/min (ref >60)
GFR calc non Af Amer: 57 mL/min — ABNORMAL LOW (ref >60)
GLUCOSE: 110 mg/dL — AB (ref 65–99)
Potassium: 3 mmol/L — ABNORMAL LOW (ref 3.5–5.1)
Sodium: 140 mmol/L (ref 135–145)

## 2017-01-09 LAB — BPAM PLATELET PHERESIS
BLOOD PRODUCT EXPIRATION DATE: 201809192359
ISSUE DATE / TIME: 201809190656
UNIT TYPE AND RH: 6200

## 2017-01-09 LAB — PHOSPHORUS
Phosphorus: 1.9 mg/dL — ABNORMAL LOW (ref 2.5–4.6)
Phosphorus: 2.9 mg/dL (ref 2.5–4.6)

## 2017-01-09 LAB — MAGNESIUM
MAGNESIUM: 1.5 mg/dL — AB (ref 1.7–2.4)
MAGNESIUM: 2.1 mg/dL (ref 1.7–2.4)

## 2017-01-09 MED ORDER — RACEPINEPHRINE HCL 2.25 % IN NEBU: 0.5000 mL | INHALATION_SOLUTION | RESPIRATORY_TRACT | Status: DC | PRN

## 2017-01-09 MED ORDER — POTASSIUM PHOSPHATES 15 MMOLE/5ML IV SOLN
30.0000 mmol | Freq: Once | INTRAVENOUS | Status: AC
  Administered 2017-01-09: 30 mmol via INTRAVENOUS
  Filled 2017-01-09: qty 10

## 2017-01-09 MED ORDER — IPRATROPIUM-ALBUTEROL 0.5-2.5 (3) MG/3ML IN SOLN
3.0000 mL | Freq: Four times a day (QID) | RESPIRATORY_TRACT | Status: DC
  Administered 2017-01-09 – 2017-01-10 (×4): 3 mL via RESPIRATORY_TRACT
  Filled 2017-01-09 (×5): qty 3

## 2017-01-09 MED ORDER — INSULIN ASPART 100 UNIT/ML ~~LOC~~ SOLN
2.0000 [IU] | SUBCUTANEOUS | Status: DC
  Administered 2017-01-09 (×3): 4 [IU] via SUBCUTANEOUS
  Administered 2017-01-10 (×2): 2 [IU] via SUBCUTANEOUS
  Administered 2017-01-10: 4 [IU] via SUBCUTANEOUS

## 2017-01-09 MED ORDER — ORAL CARE MOUTH RINSE
15.0000 mL | Freq: Two times a day (BID) | OROMUCOSAL | Status: DC
  Administered 2017-01-09 – 2017-01-10 (×3): 15 mL via OROMUCOSAL

## 2017-01-09 MED ORDER — MAGNESIUM SULFATE 2 GM/50ML IV SOLN
2.0000 g | Freq: Once | INTRAVENOUS | Status: AC
  Administered 2017-01-09: 2 g via INTRAVENOUS
  Filled 2017-01-09: qty 50

## 2017-01-09 NOTE — Progress Notes (Signed)
Inpatient Diabetes Program Recommendations  AACE/ADA: New Consensus Statement on Inpatient Glycemic Control (2015)  Target Ranges:  Prepandial:   less than 140 mg/dL      Peak postprandial:   less than 180 mg/dL (1-2 hours)      Critically ill patients:  140 - 180 mg/dL   Lab Results  Component Value Date   GLUCAP 164 (H) 01/09/2017    Review of Glycemic ControlResults for Bryan Cherry, POPOFF (MRN [id number]) as of 01/09/2017 09:28  Ref. Range 01/09/2017 02:13 01/09/2017 03:12 01/09/2017 04:14 01/09/2017 05:19 01/09/2017 06:03 01/09/2017 07:48 01/09/2017 08:51  Glucose-Capillary Latest Ref Range: 65 - 99 mg/dL 409 (H) 811 (H) 914 (H) 93 109 (H) 117 (H) 164 (H)   Diabetes history: None Outpatient Diabetes medications: None Current orders for Inpatient glycemic control:  IV insulin/ICU glycemic control order set  Inpatient Diabetes Program Recommendations:    Blood sugars improved with IV insulin.  He has had 5 blood sugars<180 mg/dL, and likely will be able to transition off insulin drip today.  Likely will not need basal insulin due to low insulin drip rates and no history of diabetes.  Will discuss with RN.   Thanks, Beryl Meager, RN, BC-ADM Inpatient Diabetes Coordinator Pager [phone] (8a-5p)
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
## 2017-01-09 NOTE — Progress Notes (Signed)
Subjective: Patient intubated on ventilator. He will open eyes to voice.  Objective: Vital signs in last 24 hours: Temp:  [98.4 F (36.9 C)-99.9 F (37.7 C)] 99.7 F (37.6 C) (09/20 0400) Pulse Rate:  [58-98] 92 (09/20 0900) Resp:  [13-29] 21 (09/20 0900) BP: (73-198)/(41-90) 155/74 (09/20 0900) SpO2:  [93 %-100 %] 98 % (09/20 0900) Arterial Line BP: (74-219)/(21-76) 183/59 (09/20 0900) FiO2 (%):  [40 %-100 %] 40 % (09/20 0900) Weight:  [65.1 kg (143 lb 8.3 oz)] 65.1 kg (143 lb 8.3 oz) (09/20 0045)  Intake/Output from previous day: 09/19 0701 - 09/20 0700 In: 6727.1 [I.V.:3208.6; Blood:880.8; NG/GT:538; IV Piggyback:2099.7] Out: 1314 [Urine:659; Drains:455; Blood:200] Intake/Output this shift: Total I/O In: 230.3 [I.V.:150.3; NG/GT:80] Out: 125 [Urine:125]  Patient awakens to voice. Pupils are small and reactive bilaterally. Gaze is conjugate. Moving all 4 extremities. Patient with persistent left upper extremity tremor. Follows commands with greater encouragement bilaterally. Drain output continues to put out bloody fluid. Wound clean and dry otherwise. Chest and abdomen benign.  Lab Results:  Recent Labs  01-09-2017 1032 09-Jan-2017 2140 01/09/17 0436  WBC 6.6  --  8.0  HGB 6.7* 9.6* 10.0*  HCT 21.5* 29.7* 30.0*  PLT 85*  --  80*   BMET  Recent Labs  January 09, 2017 1032 01/09/17 0436  NA 136 140  K 3.2* 3.0*  CL 106 113*  CO2 19* 20*  GLUCOSE 280* 110*  BUN 13 24*  CREATININE 1.29* 1.12  CALCIUM 7.2* 7.1*    Studies/Results: Ct Head Wo Contrast  Result Date: 01/09/2017 CLINICAL DATA:  Intracranial hemorrhage EXAM: CT HEAD WITHOUT CONTRAST TECHNIQUE: Contiguous axial images were obtained from the base of the skull through the vertex without intravenous contrast. COMPARISON:  Head CT 09-Jan-2017 FINDINGS: Brain: There is a subdural drainage catheter along the right convexity. The amount of blood along the right convexity is increased compared to the most recent study,  measuring up to 10 mm. This may be due to re-accumulation or redistribution. The parafalcine component of the hemorrhage is unchanged. Mass effect on the right lateral ventricle is unchanged. Leftward midline shift measures 3 mm, unchanged. No intraparenchymal hematoma. No hydrocephalus. Vascular: Atherosclerotic calcification of the internal carotid arteries and vertebral arteries at the skullbase. Skull: Status post right frontotemporal craniotomy. Sinuses/Orbits: Clear paranasal sinuses an mastoids. Normal orbits. IMPRESSION: 1. Increased amount of blood along the right convexity compared to the most recent study, either due to re-accumulation or redistribution. Continued short term follow-up recommended to assess for continued bleeding. The parafalcine component of the hematoma is unchanged. 2. Unchanged 3 mm of leftward midline shift. 3. No hydrocephalus or ventricular trapping. Electronically Signed   By: Deatra Robinson M.D.   On: 01/09/2017 02:43   Ct Head Wo Contrast  Result Date: 2017/01/09 CLINICAL DATA:  Status post seizure x2 this morning since surgery for evacuation of a subdural hematoma. EXAM: CT HEAD WITHOUT CONTRAST TECHNIQUE: Contiguous axial images were obtained from the base of the skull through the vertex without intravenous contrast. COMPARISON:  Head CT scan earlier today. FINDINGS: Brain: Since the prior study, the patient has undergone evacuation of a subdural hematoma over the right convexities. Surgical drain is in place. Subdural hematoma along the right side of the fall axis unchanged approximately 1.1 cm. Small amount of subdural blood along the right tentorium. No new hemorrhage is identified. No evidence of acute infarction, hydrocephalus. 0.7 cm right to left midline shift on the prior exam is now approximately 0.4 cm. Vascular:  Extensive atherosclerosis noted. Skull: Craniotomy defect on the right is seen. Sinuses/Orbits: Clear. Other: OG and ET tubes are in place. IMPRESSION:  Status post evacuation of a subdural hemorrhage over the right cerebral convexities with decreased right to left midline shift. No new abnormality. Subdural hemorrhage along the right side of the falx is unchanged. Electronically Signed   By: Drusilla Kanner M.D.   On: 01/26/2017 10:46   Ct Head Wo Contrast  Result Date: 01-26-2017 CLINICAL DATA:  Fall EXAM: CT HEAD WITHOUT CONTRAST CT CERVICAL SPINE WITHOUT CONTRAST TECHNIQUE: Multidetector CT imaging of the head and cervical spine was performed following the standard protocol without intravenous contrast. Multiplanar CT image reconstructions of the cervical spine were also generated. COMPARISON:  None. FINDINGS: CT HEAD FINDINGS Brain: There is a large right hemisphere subdural hematoma. There are components along the periphery of the convexity and along the right aspect of the cerebral falx. The peripheral component measures 1.5 cm in thickness. The parafalcine component measures 1.1 cm in thickness. There is 6 mm of leftward midline shift at the level of the foramina of [place]. There is mass effect on the right lateral ventricle. The basal cisterns are patent. No hydrocephalus. No intraparenchymal hematoma. Vascular: Atherosclerotic calcification of the vertebral and internal carotid arteries at the skull base. Skull: Normal visualized skull base, calvarium and extracranial soft tissues. Sinuses/Orbits: No sinus fluid levels or advanced mucosal thickening. No mastoid effusion. Normal orbits. CT CERVICAL SPINE FINDINGS Alignment: No static subluxation. Facets are aligned. Occipital condyles are normally positioned. Skull base and vertebrae: No acute fracture. Soft tissues and spinal canal: No prevertebral fluid or swelling. No visible canal hematoma. Disc levels: Multilevel severe disc space loss and facet hypertrophy resulting in severe foraminal stenosis at left C5-C6 and at least moderate foraminal stenosis bilaterally at C4-C5. Upper chest: Biapical  emphysema. Other: Normal visualized paraspinal cervical soft tissues. IMPRESSION: 1. Large right hemispheric subdural hematoma with components along the right convexity and along the falx cerebri. 6 millimeters of leftward midline shift. 2. No skull fracture. 3. No acute fracture or static subluxation of the cervical spine. 4. Multilevel cervical degenerative disc disease and facet arthrosis with moderate to severe C5-6 and C4-5 neural foraminal stenosis. Critical Value/emergent results were called by telephone at the time of interpretation on 01/26/2017 at 4:21 am to Dr. Roxy Horseman , who verbally acknowledged these results. Electronically Signed   By: Deatra Robinson M.D.   On: 01/26/17 04:29   Ct Cervical Spine Wo Contrast  Result Date: 2017/01/26 CLINICAL DATA:  Fall EXAM: CT HEAD WITHOUT CONTRAST CT CERVICAL SPINE WITHOUT CONTRAST TECHNIQUE: Multidetector CT imaging of the head and cervical spine was performed following the standard protocol without intravenous contrast. Multiplanar CT image reconstructions of the cervical spine were also generated. COMPARISON:  None. FINDINGS: CT HEAD FINDINGS Brain: There is a large right hemisphere subdural hematoma. There are components along the periphery of the convexity and along the right aspect of the cerebral falx. The peripheral component measures 1.5 cm in thickness. The parafalcine component measures 1.1 cm in thickness. There is 6 mm of leftward midline shift at the level of the foramina of [place]. There is mass effect on the right lateral ventricle. The basal cisterns are patent. No hydrocephalus. No intraparenchymal hematoma. Vascular: Atherosclerotic calcification of the vertebral and internal carotid arteries at the skull base. Skull: Normal visualized skull base, calvarium and extracranial soft tissues. Sinuses/Orbits: No sinus fluid levels or advanced mucosal thickening. No mastoid effusion. Normal orbits.  CT CERVICAL SPINE FINDINGS Alignment: No  static subluxation. Facets are aligned. Occipital condyles are normally positioned. Skull base and vertebrae: No acute fracture. Soft tissues and spinal canal: No prevertebral fluid or swelling. No visible canal hematoma. Disc levels: Multilevel severe disc space loss and facet hypertrophy resulting in severe foraminal stenosis at left C5-C6 and at least moderate foraminal stenosis bilaterally at C4-C5. Upper chest: Biapical emphysema. Other: Normal visualized paraspinal cervical soft tissues. IMPRESSION: 1. Large right hemispheric subdural hematoma with components along the right convexity and along the falx cerebri. 6 millimeters of leftward midline shift. 2. No skull fracture. 3. No acute fracture or static subluxation of the cervical spine. 4. Multilevel cervical degenerative disc disease and facet arthrosis with moderate to severe C5-6 and C4-5 neural foraminal stenosis. Critical Value/emergent results were called by telephone at the time of interpretation on 01/19/2017 at 4:21 am to Dr. Roxy Horseman , who verbally acknowledged these results. Electronically Signed   By: Deatra Robinson M.D.   On: 01/19/2017 04:29   Dg Chest Port 1 View  Result Date: 01/09/2017 CLINICAL DATA:  Respiratory difficulty.  Subdural hematoma. EXAM: PORTABLE CHEST 1 VIEW COMPARISON:  2017-01-19 FINDINGS: Endotracheal tube, NG tube, double lead left subclavian pacemaker device are stable. Hazy airspace disease at the left base has increased. Skin folds at the right apex simulate pneumothorax. Normal vascularity. No pleural effusion. IMPRESSION: Increasing airspace disease at the left base. Electronically Signed   By: Jolaine Click M.D.   On: 01/09/2017 07:59   Dg Chest Port 1 View  Result Date: 01-19-2017 CLINICAL DATA:  Intubated EXAM: PORTABLE CHEST 1 VIEW COMPARISON:  None. FINDINGS: Endotracheal tube tip is 5.5 cm above the carina. Enteric tube terminates in the gastric fundus. Intact sternotomy wires. Two lead left  subclavian pacemaker is noted with lead tips overlying the right atrium and right ventricle. Normal heart size. Aortic atherosclerosis. Otherwise normal mediastinal contour. No pneumothorax. No pleural effusion. No pulmonary edema. No acute consolidative airspace disease. IMPRESSION: 1. Well-positioned support structures.  No pneumothorax. 2. No active cardiopulmonary disease. Electronically Signed   By: Delbert Phenix M.D.   On: 01-19-2017 11:08    Assessment/Plan: Status post emergent craniotomy and evacuation of subdural hematoma. Patient with some recurrent right-sided subdural hemorrhage without significant mass effect. Continue drain and ICU observation. Okay to work towards extubation.  LOS: 1 day     Shandon Matson A 01/09/2017, 9:52 AM
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
## 2017-01-09 NOTE — Procedures (Signed)
Extubation Procedure Note  Patient Details:   Name: Bryan Cherry DOB: 1928/03/16 MRN: [id number]   Airway Documentation:     Evaluation  O2 sats: stable throughout Complications: No apparent complications Patient did tolerate procedure well. Bilateral Breath Sounds: Clear   Yes pt able to vocalize.  Pt extubated per MD order. PT was able to breathe around deflated cuff. No complications. Pt placed on 4L . No stridor noted at this time. Pt has strong adequate cough.  Loyal Jacobson [facility] 01/09/2017, 10:16 AM
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
## 2017-01-09 NOTE — Progress Notes (Addendum)
Patient extubated earlier today to 4L O2. Speech then performed a swallow study with him, which he failed. Shortly thereafter he had worsening hypoxia now Pox 91-94% on 6L O2. No respiratory distress; RR 16-20 with no accessory muscle use. Patient is speaking in full sentences. He has some mild stridor on neck auscultation. He says he feels that he needs to cough something up but can't. Will ask RT to NT suction him. If stridor continues, will order racemic epi neb. Start chest PT and IS  Bryan Obey, MD Pulmonary & Critical Care Medicine

## 2017-01-09 NOTE — Progress Notes (Signed)
eLink Physician-Brief Progress Note Patient Name: Bryan Cherry DOB: 1927/09/26 MRN: [id number]   Date of Service  01/09/2017  HPI/Events of Note  Pt extubtated today now wheezing and dropping sats on 6lpm  - he looks comfortable on camera but high risk   eICU Interventions  Called PCCM doc to eval     Intervention Category Major Interventions: Respiratory failure - evaluation and management  Sandrea Hughs 01/09/2017, 5:06 PM

## 2017-01-10 LAB — BASIC METABOLIC PANEL
ANION GAP: 6 (ref 5–15)
BUN: 21 mg/dL — ABNORMAL HIGH (ref 6–20)
CHLORIDE: 113 mmol/L — AB (ref 101–111)
CO2: 22 mmol/L (ref 22–32)
Calcium: 7.4 mg/dL — ABNORMAL LOW (ref 8.9–10.3)
Creatinine, Ser: 1.11 mg/dL (ref 0.61–1.24)
GFR calc Af Amer: >60 mL/min (ref >60)
GFR, EST NON AFRICAN AMERICAN: 58 mL/min — AB (ref >60)
GLUCOSE: 111 mg/dL — AB (ref 65–99)
POTASSIUM: 3.3 mmol/L — AB (ref 3.5–5.1)
SODIUM: 141 mmol/L (ref 135–145)

## 2017-01-10 LAB — CBC
HCT: 28.7 % — ABNORMAL LOW (ref 39.0–52.0)
HEMOGLOBIN: 10 g/dL — AB (ref 13.0–17.0)
MCH: 23.8 pg — ABNORMAL LOW (ref 26.0–34.0)
MCHC: 34.8 g/dL (ref 30.0–36.0)
MCV: 68.2 fL — AB (ref 78.0–100.0)
PLATELETS: 82 10*3/uL — AB (ref 150–400)
RBC: 4.21 MIL/uL — ABNORMAL LOW (ref 4.22–5.81)
RDW: 17.9 % — ABNORMAL HIGH (ref 11.5–15.5)
WBC: 9.3 10*3/uL (ref 4.0–10.5)

## 2017-01-10 LAB — GLUCOSE, CAPILLARY
GLUCOSE-CAPILLARY: 124 mg/dL — AB (ref 65–99)
GLUCOSE-CAPILLARY: 166 mg/dL — AB (ref 65–99)
GLUCOSE-CAPILLARY: 67 mg/dL (ref 65–99)
Glucose-Capillary: 124 mg/dL — ABNORMAL HIGH (ref 65–99)
Glucose-Capillary: 124 mg/dL — ABNORMAL HIGH (ref 65–99)
Glucose-Capillary: 210 mg/dL — ABNORMAL HIGH (ref 65–99)
Glucose-Capillary: 59 mg/dL — ABNORMAL LOW (ref 65–99)

## 2017-01-10 LAB — PHOSPHORUS: Phosphorus: 2.3 mg/dL — ABNORMAL LOW (ref 2.5–4.6)

## 2017-01-10 LAB — MAGNESIUM: MAGNESIUM: 2 mg/dL (ref 1.7–2.4)

## 2017-01-10 MED ORDER — LORAZEPAM 2 MG/ML PO CONC: 1.0000 mg | ORAL | Status: DC | PRN

## 2017-01-10 MED ORDER — POTASSIUM PHOSPHATES 15 MMOLE/5ML IV SOLN
30.0000 mmol | Freq: Once | INTRAVENOUS | Status: AC
  Administered 2017-01-10: 30 mmol via INTRAVENOUS
  Filled 2017-01-10: qty 10

## 2017-01-10 MED ORDER — ONDANSETRON 4 MG PO TBDP: 4.0000 mg | ORAL_TABLET | Freq: Four times a day (QID) | ORAL | Status: DC | PRN

## 2017-01-10 MED ORDER — HALOPERIDOL LACTATE 2 MG/ML PO CONC
0.5000 mg | ORAL | Status: DC | PRN
  Filled 2017-01-10: qty 0.3

## 2017-01-10 MED ORDER — MORPHINE BOLUS VIA INFUSION
2.0000 mg | INTRAVENOUS | Status: DC | PRN
  Filled 2017-01-10: qty 2

## 2017-01-10 MED ORDER — SODIUM CHLORIDE 0.9 % IV SOLN
1.0000 mg/h | INTRAVENOUS | Status: DC
  Administered 2017-01-10: 5 mg/h via INTRAVENOUS
  Filled 2017-01-10: qty 10

## 2017-01-10 MED ORDER — DEXTROSE 50 % IV SOLN
INTRAVENOUS | Status: AC
  Filled 2017-01-10: qty 50

## 2017-01-10 MED ORDER — GLYCOPYRROLATE 1 MG PO TABS
1.0000 mg | ORAL_TABLET | ORAL | Status: DC | PRN
  Filled 2017-01-10: qty 1

## 2017-01-10 MED ORDER — GLYCOPYRROLATE 0.2 MG/ML IJ SOLN: 0.2000 mg | INTRAMUSCULAR | Status: DC | PRN

## 2017-01-10 MED ORDER — IPRATROPIUM-ALBUTEROL 0.5-2.5 (3) MG/3ML IN SOLN: 3.0000 mL | Freq: Four times a day (QID) | RESPIRATORY_TRACT | Status: DC

## 2017-01-10 MED ORDER — BIOTENE DRY MOUTH MT LIQD: 15.0000 mL | OROMUCOSAL | Status: DC | PRN

## 2017-01-10 MED ORDER — LORAZEPAM 2 MG/ML IJ SOLN: 1.0000 mg | INTRAMUSCULAR | Status: DC | PRN

## 2017-01-10 MED ORDER — ONDANSETRON HCL 4 MG/2ML IJ SOLN: 4.0000 mg | Freq: Four times a day (QID) | INTRAMUSCULAR | Status: DC | PRN

## 2017-01-10 MED ORDER — POLYVINYL ALCOHOL 1.4 % OP SOLN
1.0000 [drp] | Freq: Four times a day (QID) | OPHTHALMIC | Status: DC | PRN
  Filled 2017-01-10: qty 15

## 2017-01-10 MED ORDER — ACETAMINOPHEN 650 MG RE SUPP: 650.0000 mg | Freq: Four times a day (QID) | RECTAL | Status: DC | PRN

## 2017-01-10 MED ORDER — HALOPERIDOL LACTATE 5 MG/ML IJ SOLN: 0.5000 mg | INTRAMUSCULAR | Status: DC | PRN

## 2017-01-10 MED ORDER — ACETAMINOPHEN 325 MG PO TABS: 650.0000 mg | ORAL_TABLET | Freq: Four times a day (QID) | ORAL | Status: DC | PRN

## 2017-01-10 MED ORDER — FENTANYL CITRATE (PF) 100 MCG/2ML IJ SOLN
INTRAMUSCULAR | Status: AC
  Filled 2017-01-10: qty 2

## 2017-01-10 MED ORDER — HALOPERIDOL 0.5 MG PO TABS
0.5000 mg | ORAL_TABLET | ORAL | Status: DC | PRN
  Filled 2017-01-10: qty 1

## 2017-01-10 MED ORDER — DEXTROSE 50 % IV SOLN
25.0000 mL | Freq: Once | INTRAVENOUS | Status: AC
  Administered 2017-01-10: 25 mL via INTRAVENOUS

## 2017-01-10 MED ORDER — LORAZEPAM 1 MG PO TABS: 1.0000 mg | ORAL_TABLET | ORAL | Status: DC | PRN

## 2017-01-10 MED ORDER — DEXTROSE-NACL 5-0.45 % IV SOLN
INTRAVENOUS | Status: DC
  Administered 2017-01-10: 09:00:00 via INTRAVENOUS

## 2017-01-10 NOTE — Progress Notes (Signed)
Called and updated son on patient's increased work of breathing and discomfort. Son, Jonny Ruiz wishes to make patient comfortable at this time. Dr. Jordan Likes notified. Girlfriend, Karena Addison at bedside with patient.

## 2017-01-10 NOTE — Progress Notes (Signed)
eLink Physician-Brief Progress Note Patient Name: Bryan Cherry DOB: [birth year] MRN: [id number]   Date of Service  01/10/2017  HPI/Events of Note  Morphine drip ordered as part of End of Life order set. No drip rate or titration goals ordered.  eICU Interventions  1. Morphine drip rate rate 1-20mg /hr clarified in order 2. Titration goals entered/clarified in order     Intervention Category Major Interventions: Other:  Lawanda Cousins 01/10/2017, 5:46 PM

## 2017-01-10 NOTE — Progress Notes (Signed)
eLink Physician-Brief Progress Note Patient Name: Cable Fearn DOB: Feb 23, 1928 MRN: [id number]   Date of Service  01/10/2017  HPI/Events of Note  Patient has transitioned to full comfort care per bedside nurse. Order set placed by primary physician.   eICU Interventions  1. Discontinuing unnecessary medications  2. discontinuing Accu-Cheks      Intervention Category Major Interventions: Other:  Lawanda Cousins 01/10/2017, 5:29 PM

## 2017-01-10 NOTE — Progress Notes (Signed)
Inpatient Diabetes Program Recommendations  AACE/ADA: New Consensus Statement on Inpatient Glycemic Control (2015)  Target Ranges:  Prepandial:   less than 140 mg/dL      Peak postprandial:   less than 180 mg/dL (1-2 hours)      Critically ill patients:  140 - 180 mg/dL   Lab Results  Component Value Date   GLUCAP 124 (H) 01/10/2017    Review of Glycemic ControlResults for EUDELL, MCPHEE (MRN [id number]) as of 01/10/2017 10:27  Ref. Range 01/09/2017 20:34 01/10/2017 00:16 01/10/2017 00:17 01/10/2017 00:44 01/10/2017 04:30 01/10/2017 08:00  Glucose-Capillary Latest Ref Range: 65 - 99 mg/dL 782 (H) 67 59 (L) 956 (H) 124 (H) 124 (H)   Inpatient Diabetes Program Recommendations:    Please reduce Novolog correction to 1-2-3 q 4 hours.   Thanks, Beryl Meager, RN, BC-ADM Inpatient Diabetes Coordinator Pager [phone] (8a-5p)

## 2017-01-10 NOTE — Progress Notes (Signed)
RT NOTE:  CPT not tolerated by patient 1st rounds. Pt now sleeping. Will attempt again in AM.

## 2017-01-10 NOTE — Progress Notes (Signed)
RT NOTE:  RT called to NTS patient. NTS done with little return. Pt very agitated throughout suction. Pt now on NBR for desaturation and mouth breathing. RT also suctioned back of mouth to generate a forceful cough from patient. Pt coughs up little secretions. Pt is very upset and states "please stop torturing me." RN aware. RT will monitor.

## 2017-01-10 NOTE — Progress Notes (Signed)
   01/10/17 1700  Clinical Encounter Type  Visited With Patient (wife Karena Addison)  Visit Type Initial;Spiritual support;Patient actively dying  Referral From Nurse  Spiritual Encounters  Spiritual Needs Prayer  Stress Factors  Patient Stress Factors (patient cant bear to hear the bad breathing close to death)  Family Stress Factors (Wife has to leave to care for 2 dogs in hotel)  Wife Karena Addison very upset. It breaks her heart to hear the hard breathing. Nurses will be giving morphine and will make him more comfortable. She told him goodbye and gave him a kiss. Wife shared her feelings. Overwhelmed and needs help with all the work to be done in regard to after his death. Had prayer with her she is waiting for her son to come. Had prayer with her and she was very Adult nurse. Phebe Colla, Chaplain
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
## 2017-01-10 NOTE — Progress Notes (Signed)
OT Cancellation Note  Patient Details Name: Bryan Cherry MRN: [id number] DOB: Oct 13, 1927   Cancelled Treatment:    Reason Eval/Treat Not Completed: Patient not medically ready (bedrest)  Boone Master B 01/10/2017, 7:45 AM

## 2017-01-10 NOTE — Progress Notes (Signed)
Patient extubated yesterday. Initially tolerated well but respiratory status is declined through this morning. Patient now moderately hypoxic with some dyspnea. Patient continues to have intermittent focal seizures which have been relieved with Ativan. He remains on Keppra and Dilantin.  He is afebrile. His vitals are stable. Drain is now putting of blood tinged CSF and has been removed. Urine output good. Positive bowel movement. Wound clean and dry. Moving all 4 extremities to command. Patient will state name and answer some simple questions. Pupils remain 2 mm with symmetric gaze.  Patient with multiple medical problems status post recent craniotomy and evacuation of acute traumatic subdural hematoma. Patient's current respiratory status is certainly a concern. I discussed situation with the patient's son. We have reached agreement that we will continue maximal care with the exception that we do not wish to proceed with intubation should his respiratory failure worsened.

## 2017-01-10 NOTE — Progress Notes (Signed)
Patient very ill and close to death. The wife was very upset at hearing the sound of his breathing. It was too much to bear.  She is worried about many things.  Had prayer and helped wife find way to front of [place] where she can get her car. Phebe Colla, Chaplain

## 2017-01-10 NOTE — Progress Notes (Signed)
SLP Cancellation Note  Patient Details Name: Euclide Granito MRN: [id number] DOB: 19-Oct-1927   Cancelled treatment:       Reason Eval/Treat Not Completed: Medical decline. D/W RN. Our services will s/o.    Blenda Mounts Laurice 01/10/2017, 9:59 AM

## 2017-01-10 NOTE — Progress Notes (Signed)
Patient actively dying. Switched to nasal cannula for comfort by RN.

## 2017-01-10 NOTE — Progress Notes (Signed)
PT Cancellation Note  Patient Details Name: Jabar Krysiak MRN: [id number] DOB: 02/24/1928   Cancelled Treatment:    Reason Eval/Treat Not Completed: Patient not medically ready. Pt currently on bedrest. Will await increased activity orders prior to initiating PT eval.    Marylynn Pearson 01/10/2017, 7:22 AM   Conni Slipper, PT, DPT Acute Rehabilitation Services Pager: [phone]

## 2017-01-20 NOTE — Progress Notes (Signed)
Pt donor services reference # is [id number] and person contacted was Joanne Chars. ME and attending physician notified of pt. Time of death. Family and pt. Significant other notified. Pt. Significant other Casper Harrison contact information [phone].

## 2017-01-20 NOTE — Progress Notes (Signed)
Wasted 180 cc morphine in sink with tammy RN

## 2017-01-20 NOTE — Discharge Summary (Signed)
Physician Discharge Summary  Patient ID: Bryan Cherry MRN: [id number] DOB/AGE: 1927-09-23 83 y.o.  Admit date: 03-Feb-2017 Discharge date: 01/13/2017  Admission Diagnoses:  Discharge Diagnoses:  Active Problems:   Subdural hematoma (HCC)   SDH (subdural hematoma) (HCC)   Acute respiratory failure with hypoxia (HCC)   Acute encephalopathy   Shock circulatory (HCC)   Seizures (HCC)   Discharged Condition: Deceased  Hospital Course: A 83 year old male admitted with acute right posttraumatic subdural hematoma. Taken operating room for evacuation of subdural hematoma was performed. Patient with postoperative seizures which eventually were controlled well enough for ventilatory weaning and extubation. Patient did well for approximately 24 hours when he began to develop symptoms of respiratory failure. Critical care thought the patient's breasts were status is very tenuous and his ability to be extricated after rehabilitation was unlikely. I discussed situation with the patient and his family. He did not wish any further interventions. Patient was made DO NOT RESUSCITATE and comfort care was instituted. The patient expired later that day.  Consults:   Significant Diagnostic Studies:   Treatments:   Discharge Exam: Blood pressure 135/83, pulse 79, temperature 99.9 F (37.7 C), temperature source Axillary, resp. rate 16, height  (1.727 m), weight 67 kg (147 lb 11.3 oz), SpO2 (!) 46 %.  Disposition: 20-Expired   Allergies as of 01/07/2017   No Known Allergies     Medication List    ASK your doctor about these medications   aspirin EC 81 MG tablet Take 81 mg by mouth daily.   COREG 25 MG tablet Generic drug:  carvedilol Take 25 mg by mouth 2 (two) times daily.   ferrous sulfate 325 (65 FE) MG tablet Take 1 tablet by mouth daily.   gabapentin 300 MG capsule Commonly known as:  NEURONTIN Take 300 mg by mouth 3 (three) times daily.   ipratropium 0.06 % nasal  spray Commonly known as:  ATROVENT Place 1 spray into the nose daily.   loperamide 2 MG capsule Commonly known as:  IMODIUM Take 2 mg by mouth as needed for diarrhea or loose stools.   nitroGLYCERIN 0.4 MG SL tablet Commonly known as:  NITROSTAT Place 0.4 mg under the tongue daily as needed.   PLAVIX 75 MG tablet Generic drug:  clopidogrel Take 75 mg by mouth daily.   primidone 50 MG tablet Commonly known as:  MYSOLINE Take 100 mg by mouth daily.   ranitidine 150 MG tablet Commonly known as:  ZANTAC Take 150 mg by mouth daily.   simvastatin 20 MG tablet Commonly known as:  ZOCOR Take 20 mg by mouth at bedtime.   temazepam 30 MG capsule Commonly known as:  RESTORIL Take 30 mg by mouth at bedtime.   timolol 0.25 % ophthalmic solution Commonly known as:  TIMOPTIC 1 drop daily  in both eyes daily   triamcinolone cream 0.1 % Commonly known as:  KENALOG Apply 1 application topically 2 (two) times daily.        Signed: Adelfa Lozito A 01/13/2017, 1:44 PM

## 2017-01-20 NOTE — Progress Notes (Signed)
RT performed NTS. Small amount of bloody secretions.

## 2017-01-20 NOTE — Progress Notes (Signed)
Pt transported to morgue with belongings (dentures and clothing).

## 2017-01-20 DEATH — deceased

## 2019-04-07 IMAGING — CT CT HEAD W/O CM
4 series · 17 of 47 positions shown, 19 images · non-contrast
Comparison: Head CT 01/08/2017

CLINICAL DATA: Intracranial hemorrhage

EXAM:
CT HEAD WITHOUT CONTRAST
TECHNIQUE: Contiguous axial images were obtained from the base of the skull
through the vertex without intravenous contrast.

[Series 3: head without · axial · non-contrast · 0.39mm/px · z∈[-157,-22]mm · 7 of 37 slices shown, 9 images]
[im 5/37  brain]
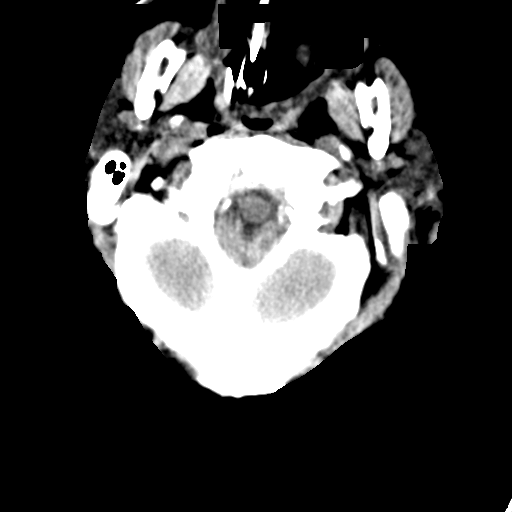
[im 5/37  bone]
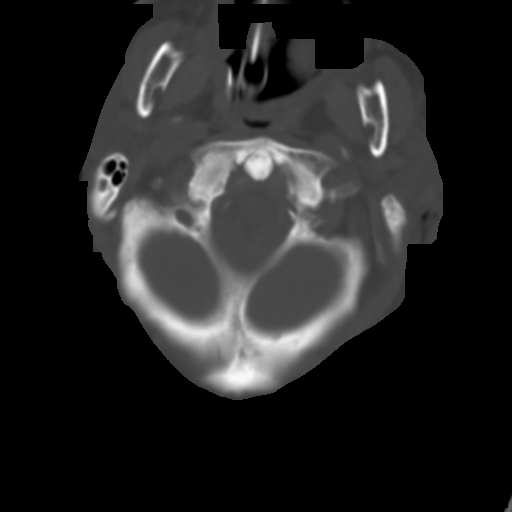
[im 10/37  brain]
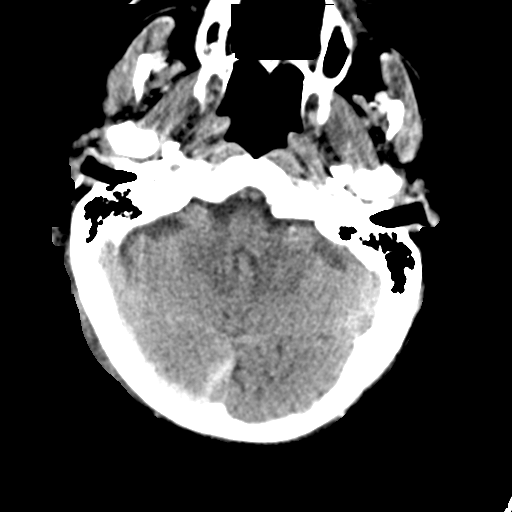
[im 14/37  brain]
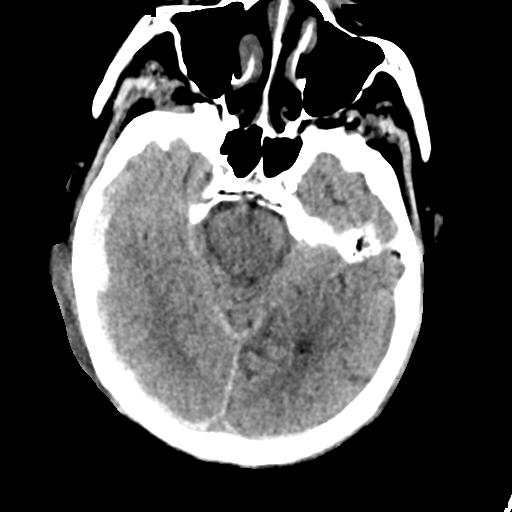
[im 19/37  brain]
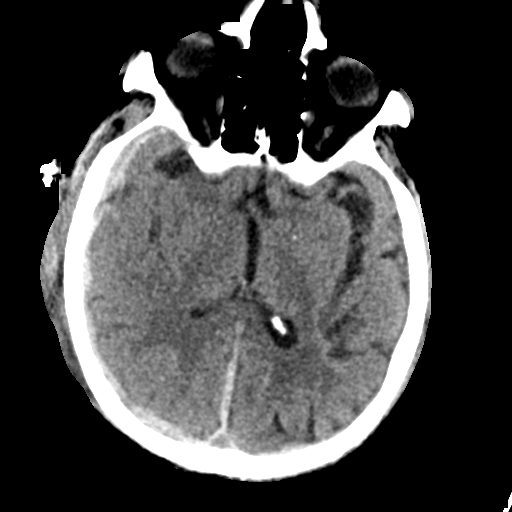
[im 23/37  brain]
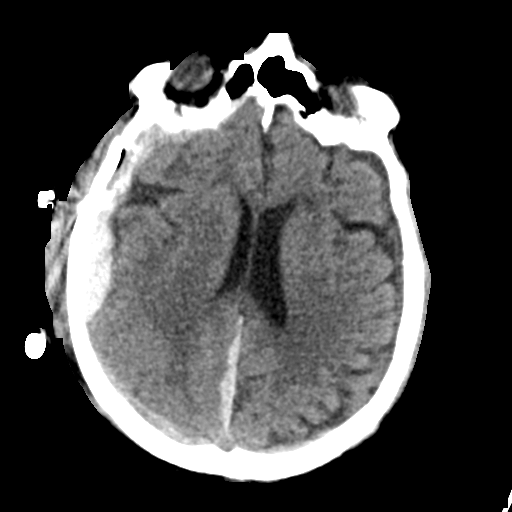
[im 23/37  bone]
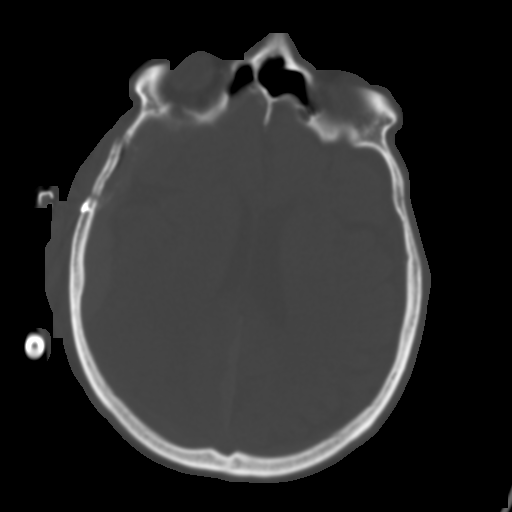
[im 28/37  brain]
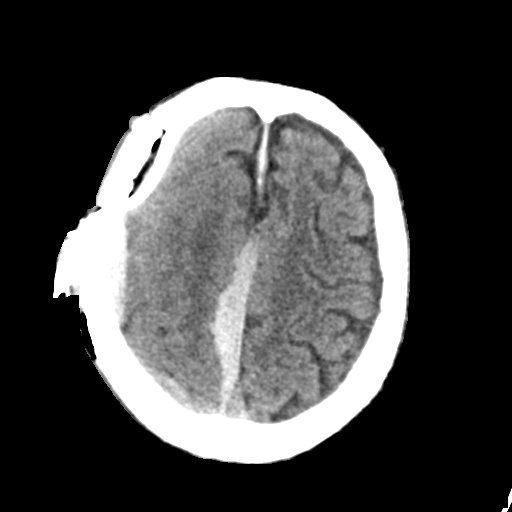
[im 32/37  brain]
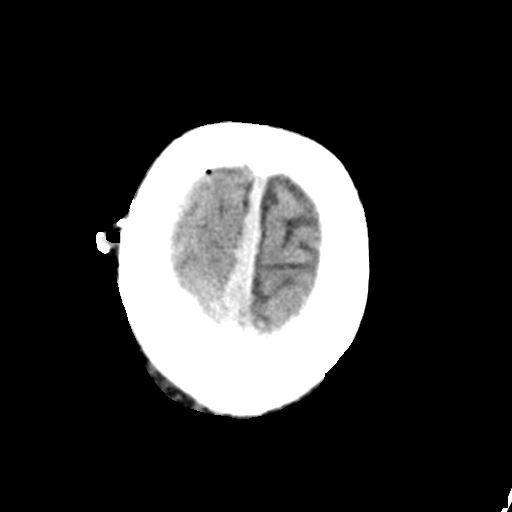

[Series 4: head bone · axial · 0.39mm/px · z∈[-159,-97]mm · 4 of 91 slices shown]
[im 10/91  bone]
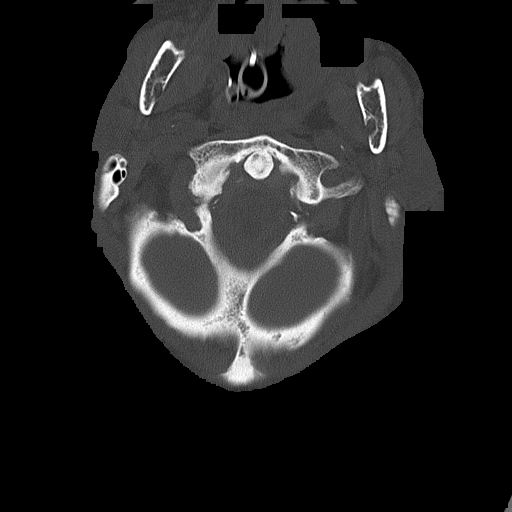
[im 19/91  bone]
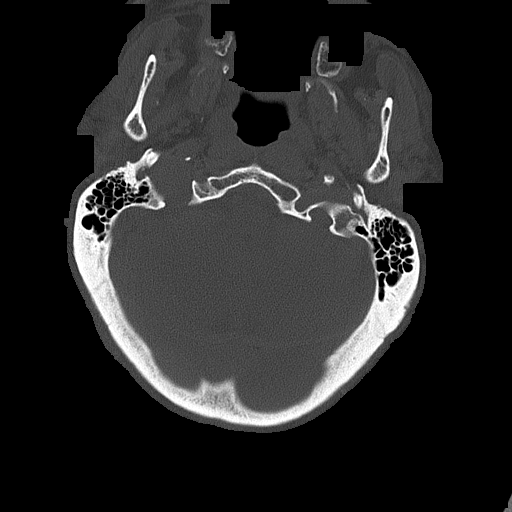
[im 28/91  bone]
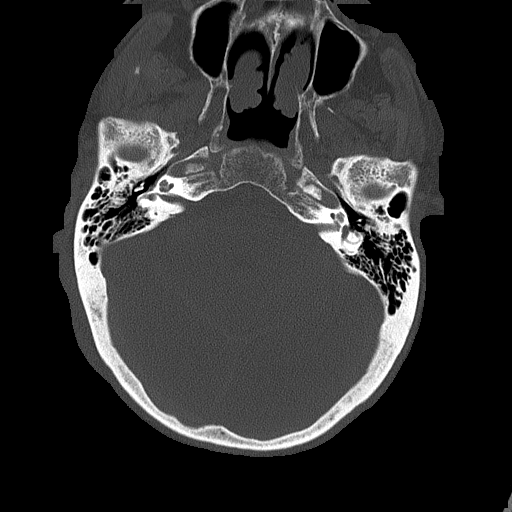
[im 41/91  bone]
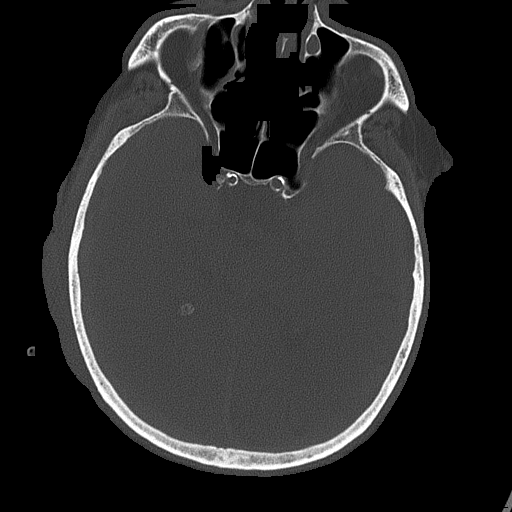

[Series 5: head without cor · coronal · non-contrast · 0.37mm/px · 3 of 72 slices shown]
[im 25/72  brain]
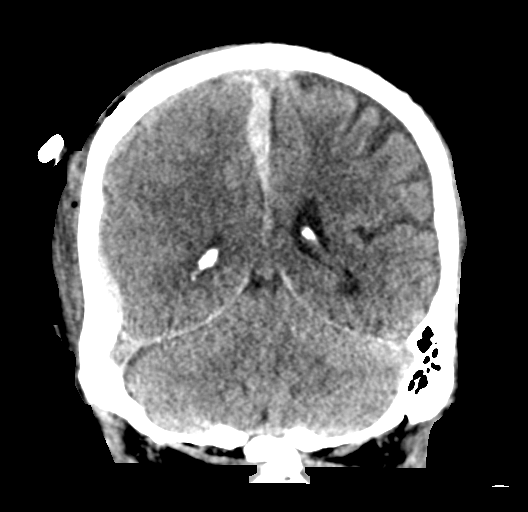
[im 32/72  brain]
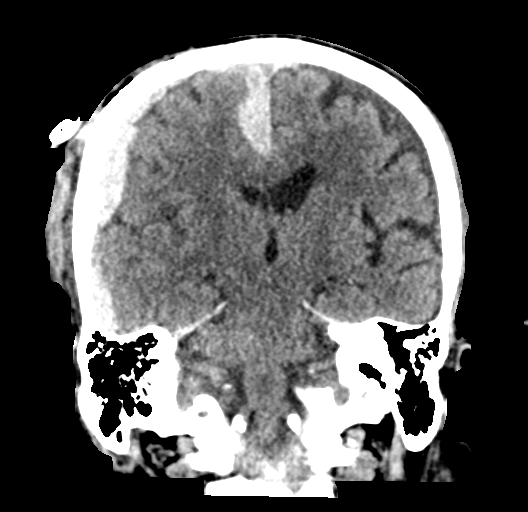
[im 40/72  brain]
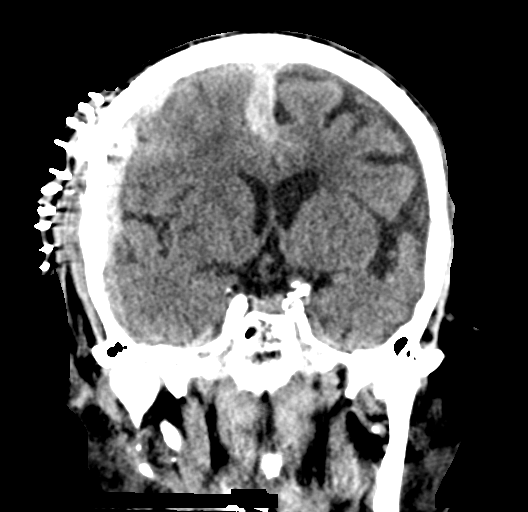

[Series 6: head without sag · sagittal · non-contrast · 0.37mm/px · 3 of 67 slices shown]
[im 23/67  brain]
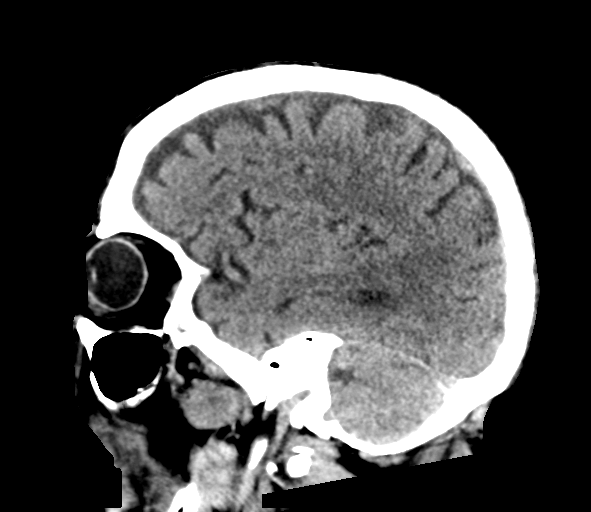
[im 34/67  brain]
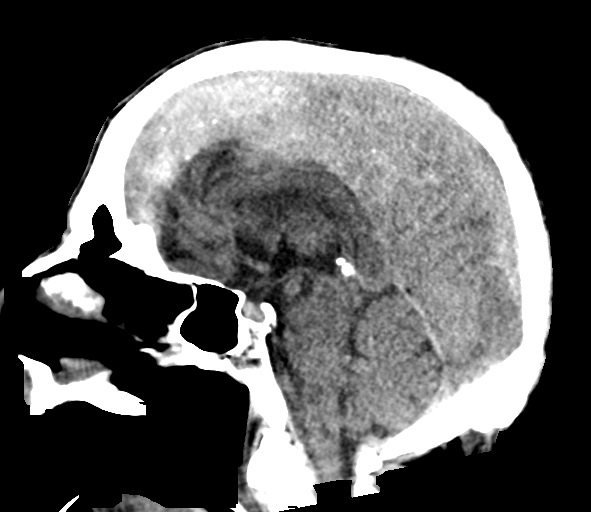
[im 45/67  brain]
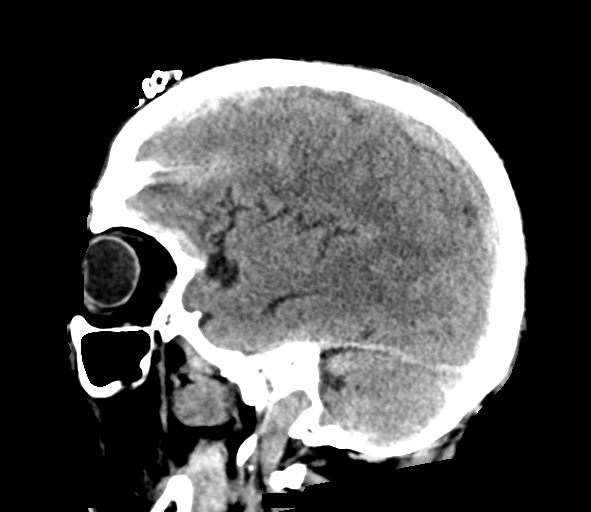

[17 of 47 positions shown; findings below may reference images not displayed]

FINDINGS: Brain: There is a subdural drainage catheter along the right
convexity. The amount of blood along the right convexity is
increased compared to the most recent study, measuring up to 10 mm.
This may be due to re-accumulation or redistribution. The
parafalcine component of the hemorrhage is unchanged. Mass effect on
the right lateral ventricle is unchanged. Leftward midline shift
measures 3 mm, unchanged. No intraparenchymal hematoma. No
hydrocephalus.

Vascular: Atherosclerotic calcification of the internal carotid
arteries and vertebral arteries at the skullbase.

Skull: Status post right frontotemporal craniotomy.

Sinuses/Orbits: Clear paranasal sinuses an mastoids. Normal orbits.
IMPRESSION: 1. Increased amount of blood along the right convexity compared to
the most recent study, either due to re-accumulation or
redistribution. Continued short term follow-up recommended to assess
for continued bleeding. The parafalcine component of the hematoma is
unchanged.
2. Unchanged 3 mm of leftward midline shift.
3. No hydrocephalus or ventricular trapping.
